# Patient Record
Sex: Female | Born: 1975 | Race: White | Hispanic: No | Marital: Married | Smoking: Never smoker
Health system: Southern US, Community
[De-identification: ages and names within clinical notes are randomized; demographics above are authoritative.]

## PROBLEM LIST (undated history)

## (undated) DIAGNOSIS — T7840XA Allergy, unspecified, initial encounter: Secondary | ICD-10-CM

## (undated) DIAGNOSIS — Z1501 Genetic susceptibility to malignant neoplasm of breast: Secondary | ICD-10-CM

## (undated) DIAGNOSIS — E785 Hyperlipidemia, unspecified: Secondary | ICD-10-CM

## (undated) DIAGNOSIS — N6320 Unspecified lump in the left breast, unspecified quadrant: Secondary | ICD-10-CM

## (undated) DIAGNOSIS — O1493 Unspecified pre-eclampsia, third trimester: Secondary | ICD-10-CM

## (undated) DIAGNOSIS — Z87442 Personal history of urinary calculi: Secondary | ICD-10-CM

## (undated) DIAGNOSIS — Z1509 Genetic susceptibility to other malignant neoplasm: Secondary | ICD-10-CM

## (undated) DIAGNOSIS — Z973 Presence of spectacles and contact lenses: Secondary | ICD-10-CM

## (undated) DIAGNOSIS — M1990 Unspecified osteoarthritis, unspecified site: Secondary | ICD-10-CM

## (undated) DIAGNOSIS — K219 Gastro-esophageal reflux disease without esophagitis: Secondary | ICD-10-CM

## (undated) DIAGNOSIS — J40 Bronchitis, not specified as acute or chronic: Secondary | ICD-10-CM

## (undated) DIAGNOSIS — F419 Anxiety disorder, unspecified: Secondary | ICD-10-CM

## (undated) DIAGNOSIS — D649 Anemia, unspecified: Secondary | ICD-10-CM

## (undated) DIAGNOSIS — C801 Malignant (primary) neoplasm, unspecified: Secondary | ICD-10-CM

## (undated) DIAGNOSIS — F32A Depression, unspecified: Secondary | ICD-10-CM

## (undated) HISTORY — DX: Allergy, unspecified, initial encounter: T78.40XA

## (undated) HISTORY — PX: MASTECTOMY: SHX3

## (undated) HISTORY — DX: Anxiety disorder, unspecified: F41.9

## (undated) HISTORY — PX: COLONOSCOPY: SHX174

## (undated) HISTORY — DX: Hyperlipidemia, unspecified: E78.5

## (undated) HISTORY — PX: KNEE ARTHROSCOPY: SUR90

## (undated) HISTORY — DX: Depression, unspecified: F32.A

## (undated) HISTORY — DX: Gastro-esophageal reflux disease without esophagitis: K21.9

## (undated) HISTORY — PX: OTHER SURGICAL HISTORY: SHX169

## (undated) HISTORY — PX: TUBAL LIGATION: SHX77

## (undated) HISTORY — DX: Unspecified osteoarthritis, unspecified site: M19.90

---

## 2004-04-16 ENCOUNTER — Observation Stay: Payer: Self-pay | Admitting: Obstetrics and Gynecology

## 2004-07-02 ENCOUNTER — Observation Stay: Payer: Self-pay

## 2004-07-13 ENCOUNTER — Inpatient Hospital Stay: Payer: Self-pay

## 2005-07-08 ENCOUNTER — Ambulatory Visit: Payer: Self-pay | Admitting: Gastroenterology

## 2005-10-31 ENCOUNTER — Emergency Department: Payer: Self-pay | Admitting: Emergency Medicine

## 2005-11-01 ENCOUNTER — Ambulatory Visit: Payer: Self-pay | Admitting: Emergency Medicine

## 2005-11-01 ENCOUNTER — Emergency Department: Payer: Self-pay | Admitting: Emergency Medicine

## 2005-12-27 ENCOUNTER — Ambulatory Visit: Payer: Self-pay | Admitting: Gastroenterology

## 2006-09-15 ENCOUNTER — Observation Stay: Payer: Self-pay

## 2006-10-07 ENCOUNTER — Ambulatory Visit: Payer: Self-pay | Admitting: Obstetrics and Gynecology

## 2006-10-16 ENCOUNTER — Observation Stay: Payer: Self-pay | Admitting: Certified Nurse Midwife

## 2006-11-23 ENCOUNTER — Inpatient Hospital Stay: Payer: Self-pay | Admitting: Obstetrics and Gynecology

## 2007-04-10 ENCOUNTER — Ambulatory Visit: Payer: Self-pay | Admitting: Obstetrics and Gynecology

## 2007-08-09 ENCOUNTER — Ambulatory Visit: Payer: Self-pay

## 2008-04-24 ENCOUNTER — Emergency Department: Payer: Self-pay | Admitting: Emergency Medicine

## 2008-04-30 ENCOUNTER — Ambulatory Visit: Payer: Self-pay | Admitting: Specialist

## 2008-05-02 ENCOUNTER — Ambulatory Visit: Payer: Self-pay | Admitting: Specialist

## 2009-04-06 ENCOUNTER — Observation Stay: Payer: Self-pay | Admitting: Obstetrics and Gynecology

## 2009-04-10 ENCOUNTER — Inpatient Hospital Stay: Payer: Self-pay | Admitting: Obstetrics and Gynecology

## 2009-11-15 IMAGING — US ULTRASOUND LEFT BREAST
1 series · 17 of 25 positions shown · non-contrast
Comparison: none

REASON FOR EXAM: pain and redness
COMMENTS:

[Series 1: ultrasound left breast · 17 of 30 slices shown]
[im 1/30]
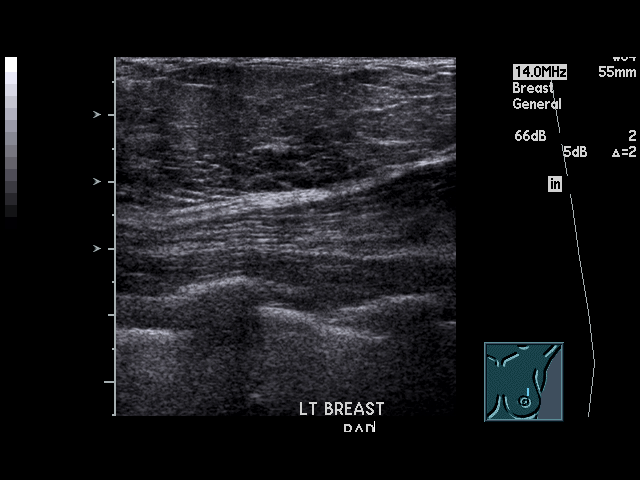
[im 3/30]
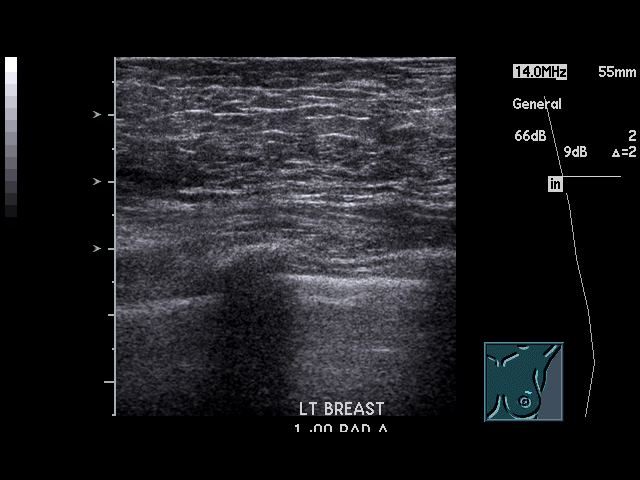
[im 4/30]
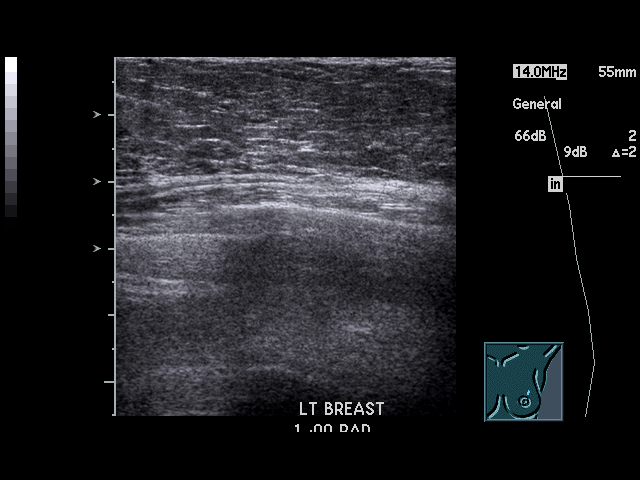
[im 7/30]
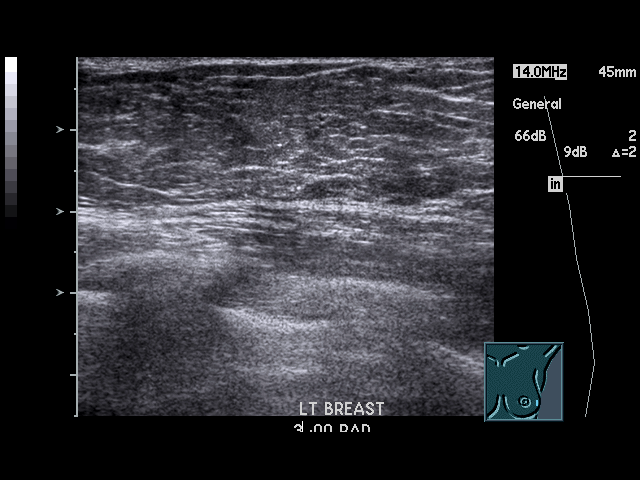
[im 8/30]
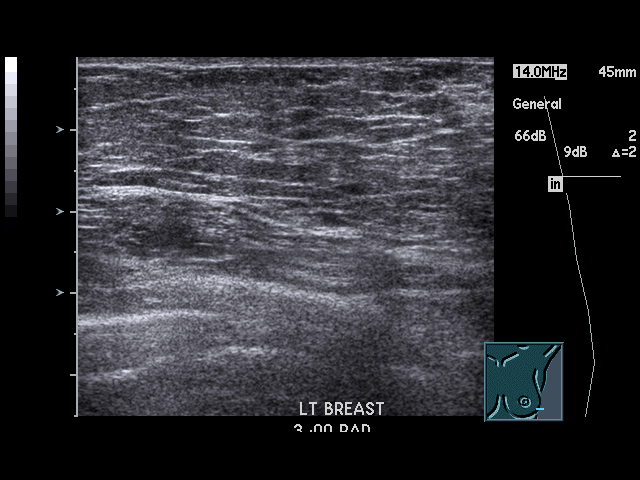
[im 10/30]
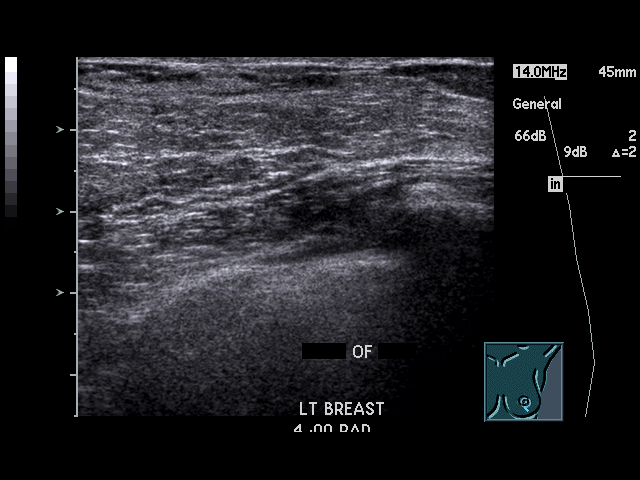
[im 11/30]
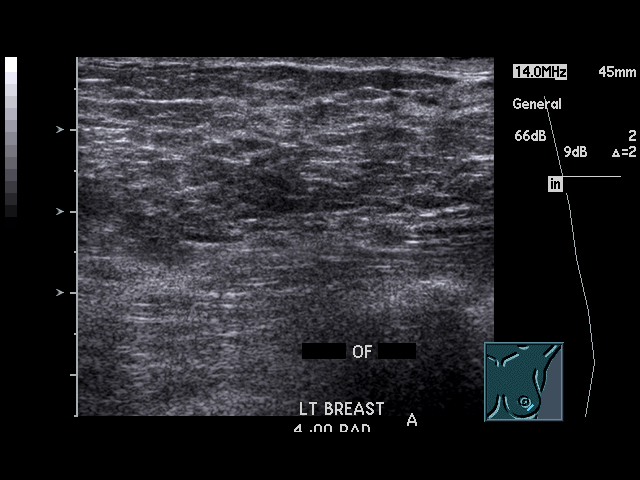
[im 14/30]
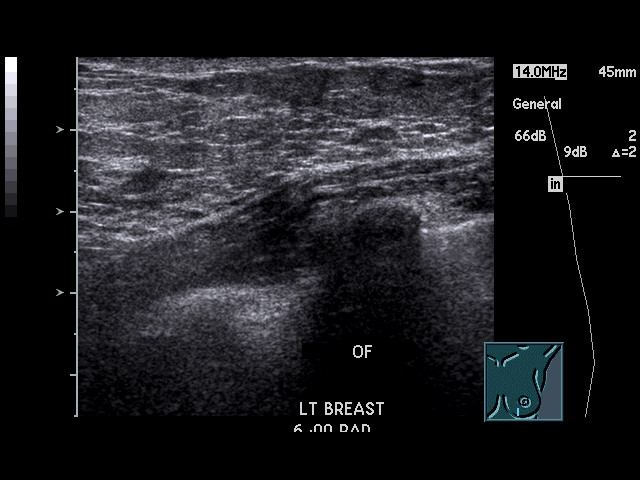
[im 15/30]
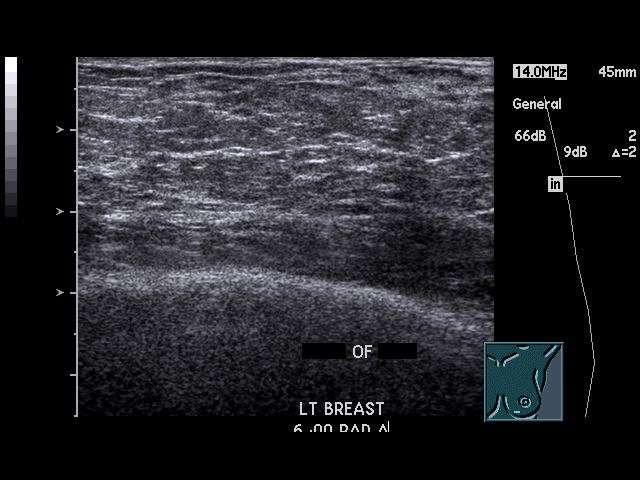
[im 16/30]
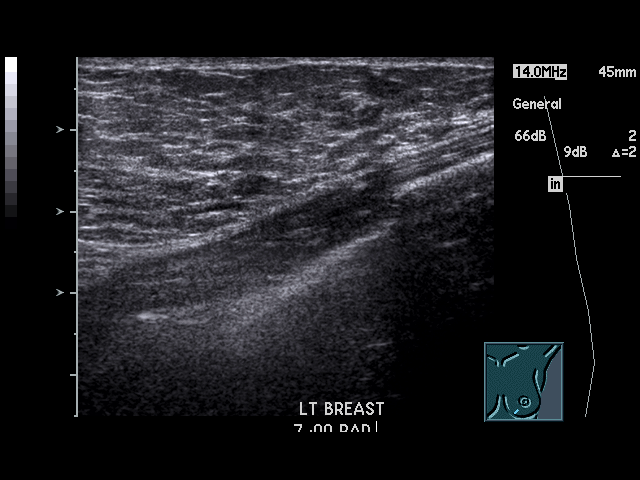
[im 19/30]
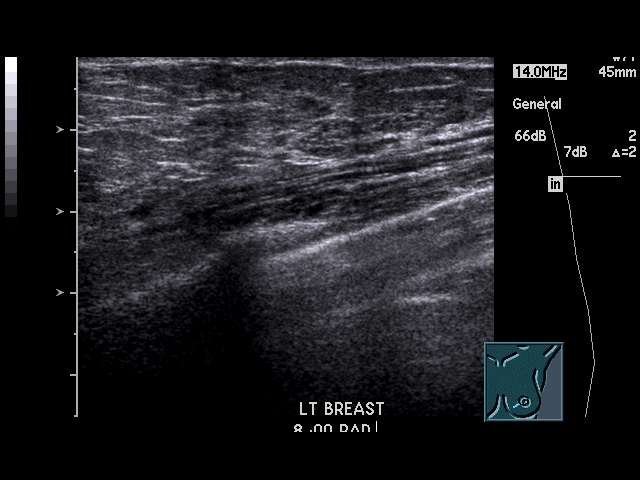
[im 20/30]
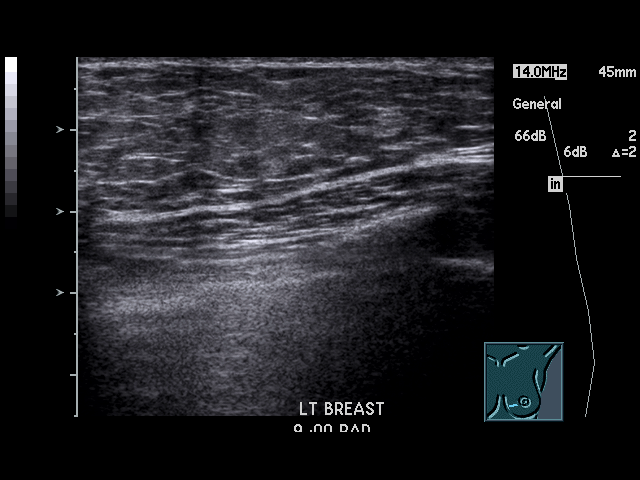
[im 22/30]
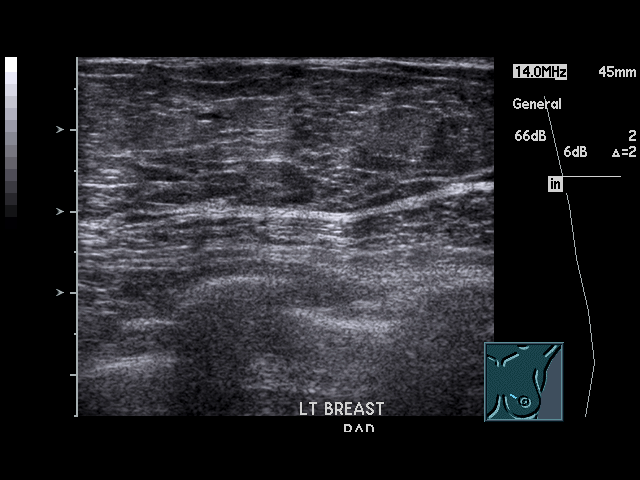
[im 23/30]
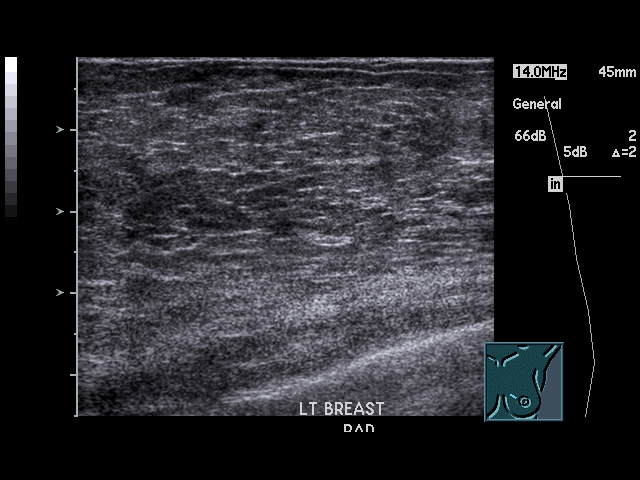
[im 26/30]
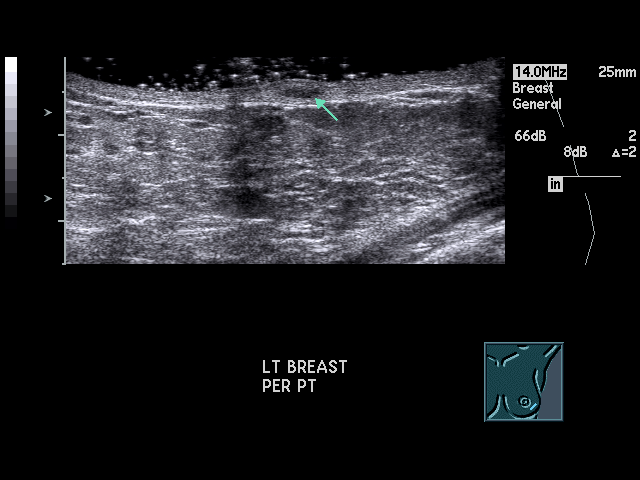
[im 27/30]
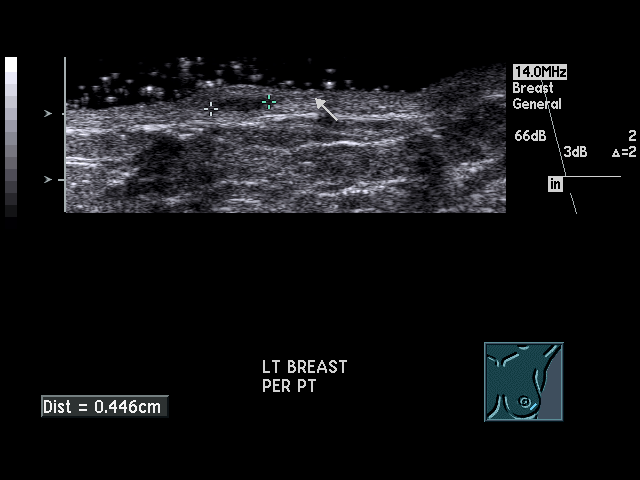
[im 30/30]
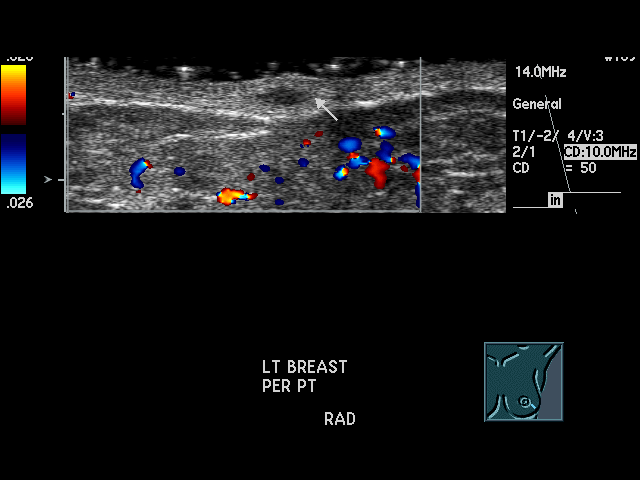

[17 of 25 positions shown; findings below may reference images not displayed]

PROCEDURE:     US  - US BREAST LEFT  - August 09, 2007  [DATE]

RESULT:     The patient has a palpable area of concern in the inferior
aspect of the LEFT breast at approximately 5 o'clock. At this site there is
an oval shaped 4.5 mm x 1.7 mm intradermal hypoechoic area consistent with a
cutaneous lesion. The etiology is indeterminate. Focal cutaneous infection,
insect or pimple are all considerations in the differential. If
symptomatology persists, Dermatology consult would be recommended since the
finding represents a cutaneous lesion. No sonographic abnormalities are
observed in the breast parenchyma.
IMPRESSION: 1.     Please see above.

## 2010-08-25 ENCOUNTER — Ambulatory Visit: Payer: Self-pay

## 2011-04-29 DIAGNOSIS — Z87442 Personal history of urinary calculi: Secondary | ICD-10-CM | POA: Insufficient documentation

## 2013-04-10 ENCOUNTER — Ambulatory Visit: Payer: BC Managed Care – PPO | Admitting: Lab

## 2013-04-10 ENCOUNTER — Encounter: Payer: Self-pay | Admitting: Genetic Counselor

## 2013-04-10 ENCOUNTER — Ambulatory Visit (HOSPITAL_BASED_OUTPATIENT_CLINIC_OR_DEPARTMENT_OTHER): Payer: BC Managed Care – PPO | Admitting: Genetic Counselor

## 2013-04-10 DIAGNOSIS — Z803 Family history of malignant neoplasm of breast: Secondary | ICD-10-CM

## 2013-04-10 DIAGNOSIS — IMO0002 Reserved for concepts with insufficient information to code with codable children: Secondary | ICD-10-CM

## 2013-04-10 DIAGNOSIS — Z8481 Family history of carrier of genetic disease: Secondary | ICD-10-CM

## 2013-04-10 NOTE — Progress Notes (Addendum)
Debra Lowery, a 37 y.o. female patient of Dr. Zelphia Cairo, was seen for discussion of her family history of breast cancer and known familial BRCA1 mutation.  She presents to clinic today to discuss the possibility of a genetic predisposition to cancer, and to further clarify her risks, as well as her family members' risks for cancer.   HISTORY OF PRESENT ILLNESS: CAROLLYNN Lowery is a 37 y.o. female with no personal history of cancer.  She had a colonoscopy in her early 30s because she thought she had chroanes  Disease, but was determined to have a virus.  She did not have any polyps.  She has not started having mammograms.  Annlouise's mother was found to have a BRCA1 mutation.  Demmi came in with her mother, to learn about the results and decided to pursue genetic testing.  History reviewed. No pertinent past medical history.  History reviewed. No pertinent past surgical history.  History   Social History  . Marital Status: Married    Spouse Name: N/A    Number of Children: 4  . Years of Education: N/A   Social History Main Topics  . Smoking status: Never Smoker   . Smokeless tobacco: None  . Alcohol Use: No  . Drug Use: No  . Sexual Activity: Yes   Other Topics Concern  . None   Social History Narrative  . None    REPRODUCTIVE HISTORY AND PERSONAL RISK ASSESSMENT FACTORS: Menarche was at age 49.   premenopausal Uterus Intact: yes Ovaries Intact: yes G4P4A0, first live birth at age 22  She has not previously undergone treatment for infertility.   Oral Contraceptive use: 5 years   She has not used HRT in the past.    FAMILY HISTORY:  We obtained a detailed, 4-generation family history.  Significant diagnoses are listed below: Family History  Problem Relation Age of Onset  . Breast cancer Mother 25  . Breast cancer Paternal Aunt 74  . Breast cancer Maternal Grandmother 34  . Esophageal cancer Maternal Grandmother   . Prostate cancer Maternal  Grandfather 3    Patient's maternal ancestors are of [place] and [place] descent, and paternal ancestors are of unknown descent. There is no reported Ashkenazi Jewish ancestry. There is no known consanguinity.  GENETIC COUNSELING ASSESSMENT: Debra Lowery is a 37 y.o. 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 We, therefore, discussed and recommended the following at today's visit.   DISCUSSION: We reviewed the characteristics, features and inheritance patterns of hereditary cancer syndromes. We also discussed genetic testing, including the appropriate family members to test, the process of testing, insurance coverage and turn-around-time for results. We discussed that she is at 50% risk for having the same mutation as her mother.  The NCCN guidelines were reviewed and we discussed that if she were positive for the BRCA1 mutation we could change her medical mangament to help keep her healthy.  This could include increased screening or risk reducing surgery.  She understood the information and wanted to get her blood drawn for genetic testing.  PLAN: After considering the risks, benefits, and limitations, Debra Lowery provided informed consent to pursue genetic testing and the blood sample will be sent to [facility] for analysis of the BRCA1 familial mutation. We discussed the implications of a positive, negative and/ or variant of uncertain significance genetic test  result. Results should be available within approximately 1-2 weeks' time, at which point they will be disclosed by telephone to Debra Lowery, as will any additional recommendations warranted by these results. Debra Lowery will receive a summary of her genetic counseling visit and a copy of her results once available. This information will also be available in Epic. We  encouraged Debra Lowery to remain in contact with cancer genetics annually so that we can continuously update the family history and inform her of any changes in cancer genetics and testing that may be of benefit for her family. Debra Lowery's questions were answered to her satisfaction today. Our contact information was provided should additional questions or concerns arise.  The patient was seen for a total of 45 minutes, greater than 50% of which was spent face-to-face counseling.  This note will also be sent to the referring provider via the electronic medical record. The patient will be supplied with a summary of this genetic counseling discussion as well as educational information on the discussed hereditary cancer syndromes following the conclusion of their visit.   Patient was discussed with Dr. Drue Second.   _______________________________________________________________________ For Office Staff:  Number of people involved in session: 3 Was an Intern/ student involved with case: no

## 2013-04-16 ENCOUNTER — Telehealth: Payer: Self-pay | Admitting: Genetic Counselor

## 2013-04-16 NOTE — Telephone Encounter (Signed)
Revealed that Debra Lowery tested positive for a BRCA1 mutation.  Debra Lowery wants to wait to come in or for making referrals to surgeon until after her mother has completed her treatment.  [name] will contact me when Debra Lowery is ready to come in.  In the meantime we will send a copy of her result to her physician, Dr. Zelphia Cairo.

## 2013-04-17 ENCOUNTER — Encounter: Payer: Self-pay | Admitting: Genetic Counselor

## 2013-04-17 DIAGNOSIS — Z1501 Genetic susceptibility to malignant neoplasm of breast: Secondary | ICD-10-CM

## 2013-04-17 DIAGNOSIS — Z1509 Genetic susceptibility to other malignant neoplasm: Secondary | ICD-10-CM

## 2013-04-17 HISTORY — DX: Genetic susceptibility to other malignant neoplasm: Z15.09

## 2013-04-17 HISTORY — DX: Genetic susceptibility to malignant neoplasm of breast: Z15.01

## 2014-03-01 ENCOUNTER — Other Ambulatory Visit: Payer: Self-pay | Admitting: Obstetrics and Gynecology

## 2014-03-01 DIAGNOSIS — Z803 Family history of malignant neoplasm of breast: Secondary | ICD-10-CM

## 2014-03-14 ENCOUNTER — Ambulatory Visit (INDEPENDENT_AMBULATORY_CARE_PROVIDER_SITE_OTHER): Payer: Self-pay | Admitting: Surgery

## 2014-03-14 DIAGNOSIS — Z1501 Genetic susceptibility to malignant neoplasm of breast: Secondary | ICD-10-CM

## 2014-03-14 DIAGNOSIS — Z1509 Genetic susceptibility to other malignant neoplasm: Principal | ICD-10-CM

## 2014-03-14 NOTE — H&P (Signed)
Debra Lowery 03/14/2014 9:27 AM Location: [facility] Patient #: [id number] DOB: Jun 17, 1975 Married / Language: [name] / Race: White Female History of Present Illness Debra Moores A. Marlo Arriola MD; 03/14/2014 5:01 PM) Patient words: pre-op visit breast   Patient sent at the request of Dr. [name] . Patient is BRCA 1 positive after genetic counseling consultation. Both her mother and grandmother have had breast cancer which prompted the evaluation. She is here to discuss options from the standpoint of her breast. She would like to discuss mastectomy as well as nonoperative options of dealing with this. Denies any history of breast pain, nipple discharge or breast mass. Last mammogram was 6 months ago and was normal. She will eventually require hysterectomy and removal of ovaries at some point she tells me.  The patient is a 38 year old female   Allergies ([place], [place]; 03/14/2014 9:28 AM) No Known Drug Allergies 03/12/2014  Medication History ([name], RMA; 03/14/2014 9:29 AM) Silymarin (Oral) Active. Acidophilus (Oral) Active. Medications Reconciled  Family History ([name], [place]; 03/14/2014 9:27 AM) Diabetes Mellitus Father. Hypertension Father.  Pregnancy / Birth History [name], [place]; 03/14/2014 9:27 AM) Contraceptive History Oral contraceptives.     Review of Systems ([name] RMA; 03/14/2014 9:27 AM) Gastrointestinal Not Present- Abdominal Pain, Bloating, Bloody Stool, Change in Bowel Habits, Chronic diarrhea, Constipation, Difficulty Swallowing, Excessive gas, Gets full quickly at meals, Hemorrhoids, Indigestion, Nausea, Rectal Pain and Vomiting. Female Genitourinary Not Present- Frequency, Nocturia, Painful Urination, Pelvic Pain and Urgency. Neurological Not Present- Decreased Memory, Fainting, Headaches, Numbness, Seizures, Tingling, Tremor, Trouble walking and Weakness.  Vitals  ([name] RMA; 03/14/2014 9:27 AM) 03/14/2014 9:27 AM Weight: 155.2 lb Height: 64in Body Surface Area: 1.78 m Body Mass Index: 26.64 kg/m Temp.: 97.42F(Oral)  Pulse: 61 (Regular)  P.OX: 98% (Room air) BP: 118/60 (Sitting, Right Arm, Standard)     Physical Exam ([name] A. Aerika Groll MD; 03/14/2014 5:02 PM)  General Mental Status-Alert. General Appearance-Consistent with stated age. Hydration-Well hydrated. Voice-Normal.  Head and Neck Head-normocephalic, atraumatic with no lesions or palpable masses. Trachea-midline. Thyroid Gland Characteristics - normal size and consistency.  Eye Eyeball - Bilateral-Extraocular movements intact. Sclera/Conjunctiva - Bilateral-No scleral icterus.  Chest and Lung Exam Chest and lung exam reveals -quiet, even and easy respiratory effort with no use of accessory muscles and on auscultation, normal breath sounds, no adventitious sounds and normal vocal resonance. Inspection Chest Wall - Normal. Back - normal.  Breast Breast - Left-Symmetric, Non Tender, No Biopsy scars, no Dimpling, No Inflammation, No Lumpectomy scars, No Mastectomy scars, No Peau d' Orange. Breast - Right-Symmetric, Non Tender, No Biopsy scars, no Dimpling, No Inflammation, No Lumpectomy scars, No Mastectomy scars, No Peau d' Orange. Breast Lump-No Palpable Breast Mass.  Cardiovascular Cardiovascular examination reveals -normal heart sounds, regular rate and rhythm with no murmurs and normal pedal pulses bilaterally.  Abdomen Inspection Inspection of the abdomen reveals - No Hernias. Skin - Scar - no surgical scars. Palpation/Percussion Palpation and Percussion of the abdomen reveal - Soft, Non Tender, No Rebound tenderness, No Rigidity (guarding) and No hepatosplenomegaly. Auscultation Auscultation of the abdomen reveals - Bowel sounds normal.  Neurologic Neurologic evaluation reveals -alert and oriented x 3 with no  impairment of recent or remote memory. Mental Status-Normal.  Musculoskeletal Normal Exam - Left-Upper Extremity Strength Normal and Lower Extremity Strength Normal. Normal Exam - Right-Upper Extremity Strength Normal and Lower Extremity Strength Normal.  Lymphatic Head & Neck  General Head & Neck Lymphatics: Bilateral -  Description - Normal. Axillary  General Axillary Region: Bilateral - Description - Normal. Tenderness - Non Tender. Femoral & Inguinal  Generalized Femoral & Inguinal Lymphatics: Bilateral - Description - Normal. Tenderness - Non Tender.    Assessment & Plan ([name] A. Kali Deadwyler MD; 03/14/2014 5:07 PM)  BRCA1 POSITIVE (V84.01  Z15.01) Impression: discussed options of bilateral mastectomy versus surveillance with MRI. Discussed nipple preservation given that she appears to be an ideal candidate. Referral to plastic surgery to discuss reconstruction. discussed utility of sentinel lymph node mapping. Would obtain MRI prior to mastectomy given density of breast on exam. She will call to schedule.  Current Plans BRCA1/BRCA2 FULL SEQUENCE ANALYSIS (81211) MASTECTOMY, NIPPLE SPARING (16429) (Procedure discussed with patient. Risk of bleeding, infection, nipple loss, lymphedema, need for other operative procedures, numbness across skin, and cosmetic deformity.) Referred to Surgery ? Plastic, for evaluation and follow up (Plastic Surgery).

## 2014-04-18 ENCOUNTER — Other Ambulatory Visit (INDEPENDENT_AMBULATORY_CARE_PROVIDER_SITE_OTHER): Payer: Self-pay

## 2014-04-18 DIAGNOSIS — Z1501 Genetic susceptibility to malignant neoplasm of breast: Secondary | ICD-10-CM

## 2014-04-18 DIAGNOSIS — Z1509 Genetic susceptibility to other malignant neoplasm: Principal | ICD-10-CM

## 2014-04-24 ENCOUNTER — Other Ambulatory Visit: Payer: BC Managed Care – PPO

## 2014-05-14 ENCOUNTER — Ambulatory Visit
Admission: RE | Admit: 2014-05-14 | Discharge: 2014-05-14 | Disposition: A | Discharge status: Home / Self Care | Payer: BC Managed Care – PPO | Source: Ambulatory Visit | Attending: Surgery | Admitting: Surgery

## 2014-05-14 DIAGNOSIS — Z1501 Genetic susceptibility to malignant neoplasm of breast: Secondary | ICD-10-CM

## 2014-05-14 DIAGNOSIS — Z1509 Genetic susceptibility to other malignant neoplasm: Principal | ICD-10-CM

## 2014-05-14 MED ORDER — GADOBENATE DIMEGLUMINE 529 MG/ML IV SOLN
13.0000 mL | Freq: Once | INTRAVENOUS | Status: AC | PRN
  Administered 2014-05-14: 13 mL via INTRAVENOUS

## 2014-05-16 ENCOUNTER — Other Ambulatory Visit (INDEPENDENT_AMBULATORY_CARE_PROVIDER_SITE_OTHER): Payer: Self-pay | Admitting: Surgery

## 2014-05-16 DIAGNOSIS — R928 Other abnormal and inconclusive findings on diagnostic imaging of breast: Secondary | ICD-10-CM

## 2014-05-24 ENCOUNTER — Ambulatory Visit
Admission: RE | Admit: 2014-05-24 | Discharge: 2014-05-24 | Disposition: A | Discharge status: Home / Self Care | Payer: BC Managed Care – PPO | Source: Ambulatory Visit | Attending: Surgery | Admitting: Surgery

## 2014-05-24 ENCOUNTER — Other Ambulatory Visit (INDEPENDENT_AMBULATORY_CARE_PROVIDER_SITE_OTHER): Payer: Self-pay | Admitting: Surgery

## 2014-05-24 DIAGNOSIS — R928 Other abnormal and inconclusive findings on diagnostic imaging of breast: Secondary | ICD-10-CM

## 2014-06-13 ENCOUNTER — Ambulatory Visit (INDEPENDENT_AMBULATORY_CARE_PROVIDER_SITE_OTHER): Payer: Self-pay | Admitting: Surgery

## 2014-06-13 DIAGNOSIS — Z1509 Genetic susceptibility to other malignant neoplasm: Principal | ICD-10-CM

## 2014-06-13 DIAGNOSIS — Z1501 Genetic susceptibility to malignant neoplasm of breast: Secondary | ICD-10-CM

## 2014-06-13 NOTE — H&P (Signed)
Debra Lowery 06/13/2014 10:02 AM Location: [facility] Patient #: [id number] DOB: 03/01/1976 Married / Language: Debra Lowery / Race: White Female History of Present Illness Debra Lowery A. Ewart Carrera MD; 06/13/2014 12:34 PM) Patient words: discuss mastectomy   pt returns to discuss bilateral simple mastectomy and reconstruction again after getter her MRI which showed a 1.8 cm left breast mas that core bx showed was benign. Discussed with radiology and they had concerns of discordant pathology despite benign core. right breast showed no worrisome lesions. pt desires bilateral mastectomy and has an interest in NSM and has discussed this with Dr Migdalia Dk. PT IS BRCA 1 POSITIVE AND HAS STRONG FAMILY HISTORY OF BREAST CANCER.  CLINICAL DATA: BRCA 1 positive patient. High risk screening evaluation.  EXAM: BILATERAL BREAST MRI WITH AND WITHOUT CONTRAST  TECHNIQUE: Multiplanar, multisequence MR images of both breasts were obtained prior to and following the intravenous administration of 96m of MultiHance  THREE-DIMENSIONAL MR IMAGE RENDERING ON INDEPENDENT WORKSTATION:  Three-dimensional MR images were rendered by post-processing of the original MR data on an independent workstation. The three-dimensional MR images were interpreted, and findings are reported in the following complete MRI report for this study. Three dimensional images were evaluated at the independent DynaCad workstation  COMPARISON: Mammograms dated 11/19/2013, 03/16/2013, 05/07/2011.  FINDINGS: Breast composition: b. Scattered fibroglandular tissue.  Background parenchymal enhancement: Mild  Right breast: No mass or abnormal enhancement.  Left breast: There is an irregular enhancing mass located within the posterior 1/3 of the upper inner quadrant of the left breast which measures 1.8 x 1.1 x 0.9 cm in size. This is associated with a mixture plateau and washout enhancement kinetics. Tissue sampling  is recommended. Left breast diagnostic mammogram and ultrasound is recommended for further evaluation. There are no additional worrisome enhancing foci within the left breast.  Lymph nodes: No abnormal appearing lymph nodes.  Ancillary findings: None.  IMPRESSION: 1.8 cm suspicious enhancing mass located within the posterior 1/3 of the upper inner quadrant of the left breast. Recommend left breast diagnostic mammogram with ultrasound at this time with a left breast ultrasound-guided core biopsy if there is an ultrasound correlate. If there is no ultrasound correlate, recommend MR guided biopsy.  RECOMMENDATION: Left breast diagnostic mammogram and ultrasound with possible left breast ultrasound-guided core biopsy as discussed above.  BI-RADS CATEGORY 4: Suspicious.   Electronically Signed By: Debra RobertsonM.D. On: 05/15/2014 14:57     Breast, left, needle core biopsy, mass, UIQ - BENIGN BREAST TISSUE, SEE COMMENT. - NEGATIVE FOR ATYPIA OR MALIGNANCY. 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 Additional findings include fibroadenomatoid nodule. Smooth muscle actin highlights the stromal hyperplasia and calponin demonstrates occasional stromal positivity. (CRR:gt:ecj 05/28/14) CMaliRUND Lowery Pathologist, Electronic.  The patient is a 38year old female   Allergies (Debra Donna COak Lowery 06/13/2014 10:03 AM) No Known Drug Allergies 03/12/2014  Medication History (Debra Lowery, CMA; 06/13/2014 10:03 AM) Acidophilus (Oral) Active. Silymarin (Oral) Active.    Vitals (Debra Lowery CMA; 06/13/2014 10:03 AM) 06/13/2014 10:03 AM Weight: 146 lb Height: 64in Body Surface Area: 1.73 m Body Mass Index: 25.06 kg/m Pulse: 75 (Regular)  BP: 124/80 (Sitting, Left Arm, Standard)     Physical Exam (Debra Zackery A. Amritha Yorke MD;  06/13/2014 12:34 PM)  Chest and Lung Exam Chest and lung exam reveals -quiet, even and easy respiratory effort with no use of accessory muscles and on auscultation, normal breath  sounds, no adventitious sounds and normal vocal resonance. Inspection Chest Wall - Normal. Back - normal.  Breast Note: post biopsy changes noted left medial breast. ptosis of both breasts noted. no masses in either breast. both axilla normal.   Cardiovascular Cardiovascular examination reveals -normal heart sounds, regular rate and rhythm with no murmurs and normal pedal pulses bilaterally.  Neurologic Neurologic evaluation reveals -alert and oriented x 3 with no impairment of recent or remote memory. Mental Status-Normal.  Musculoskeletal Normal Exam - Left-Upper Extremity Strength Normal and Lower Extremity Strength Normal. Normal Exam - Right-Upper Extremity Strength Normal, Lower Extremity Weakness.  Lymphatic Axillary  General Axillary Region: Bilateral - Description - Normal. Tenderness - Non Tender.    Assessment & Plan (Debra Felling A. Mahamadou Weltz MD; 06/13/2014 12:33 PM)  BRCA1 POSITIVE (V84.01  Z15.01) Impression: discussed options of bilateral mastectomy versus surveillance with MRI. Discussed nipple preservation given that she appears to be an ideal candidate. Referral to plastic surgery to discuss reconstruction. discussed utility of sentinel lymph node mapping. Would obtain MRI prior to mastectomy given density of breast on exam. given mass on left and concern by radiologist recommend sln mapping on left and avoid this on the right given normal MRI . Discussed treatment options for breast cancer to include breast conservation vs mastectomy with reconstruction. Pt has decided on mastectomy. Risk include bleeding, infection, flap necrosis, pain, numbness, recurrence, hematoma, other surgery needs. Pt understands and agrees to proceed. Risk of sentinel lymph node mapping include bleeding,  infection, lymphedema, shoulder pain. stiffness, dye allergy. cosmetic deformity , blood clots, death, need for more surgery. Pt agres to proceed. UNDERSTANDS LOW BUT FINITE RISK OF FINDING OCCULT CANCER AND THE NEED FOR OTHER PROCEDURES. NIPPLE PRESERVATION POSSIBLE BUT PTOSIS MAY MAKE THIS AN ISSUE. HAS DISCUSSED WITH DR Migdalia Dk ALREADY AND AWARE SHE MAY NEED TRADITIONAL MASTECTOMY. .  Current Plans Pt Education - [facility] Mastectomy [facility]

## 2014-06-14 HISTORY — PX: DILATION AND CURETTAGE OF UTERUS: SHX78

## 2014-07-09 NOTE — Pre-Procedure Instructions (Signed)
Debra Lowery  07/09/2014   Your procedure is scheduled on:  Thursday July 18, 2014 at 9:00 AM.  Report to [facility] Admitting at 7:00 AM.  Call this number if you have problems the morning of surgery: [phone]  For any other questions Monday-Friday from 8am-4pm call: [phone]  Remember:   Do not eat food or drink liquids after midnight.   Take these medicines the morning of surgery with A SIP OF WATER: None  Stop taking Aspirin, vitamins,  and herbal medications. Do not take any NSAIDs ie: Ibuprofen, Advil, Naproxen or any medication containing Aspirin; stop 1 week prior to procedure ( Thursday, July 11, 2014)  Do not wear jewelry, make-up or nail polish.  Do not wear lotions, powders, or perfumes. You may NOT wear deodorant.  Do not shave 48 hours prior to surgery.   Do not bring valuables to the hospital.  [facility] is not responsible for any belongings or valuables.               Contacts, dentures or bridgework may not be worn into surgery.  Leave suitcase in the car. After surgery it may be brought to your room.  For patients admitted to the hospital, discharge time is determined by your treatment team.               Patients discharged the day of surgery will not be allowed to drive home.  Name and phone number of your driver:   Special Instructions: Shower using CHG soap the night before and the morning of your surgery   Please read over the following fact sheets that you were given: Pain Booklet, Coughing and Deep Breathing and Surgical Site Infection Prevention

## 2014-07-10 ENCOUNTER — Encounter (HOSPITAL_COMMUNITY): Payer: Self-pay

## 2014-07-10 ENCOUNTER — Encounter (HOSPITAL_COMMUNITY)
Admission: RE | Admit: 2014-07-10 | Discharge: 2014-07-10 | Disposition: A | Discharge status: Home / Self Care | Payer: BLUE CROSS/BLUE SHIELD | Source: Ambulatory Visit | Attending: Surgery | Admitting: Surgery

## 2014-07-10 VITALS — BP 106/54 | HR 63 | Temp 97.8°F | Resp 20 | Ht 64.0 in | Wt 148.7 lb

## 2014-07-10 DIAGNOSIS — C50919 Malignant neoplasm of unspecified site of unspecified female breast: Secondary | ICD-10-CM | POA: Diagnosis not present

## 2014-07-10 DIAGNOSIS — Z1501 Genetic susceptibility to malignant neoplasm of breast: Secondary | ICD-10-CM

## 2014-07-10 DIAGNOSIS — Z01812 Encounter for preprocedural laboratory examination: Secondary | ICD-10-CM | POA: Diagnosis not present

## 2014-07-10 DIAGNOSIS — Z1509 Genetic susceptibility to other malignant neoplasm: Secondary | ICD-10-CM

## 2014-07-10 HISTORY — DX: Anemia, unspecified: D64.9

## 2014-07-10 HISTORY — DX: Genetic susceptibility to other malignant neoplasm: Z15.09

## 2014-07-10 HISTORY — DX: Presence of spectacles and contact lenses: Z97.3

## 2014-07-10 HISTORY — DX: Unspecified lump in the left breast, unspecified quadrant: N63.20

## 2014-07-10 HISTORY — DX: Genetic susceptibility to malignant neoplasm of breast: Z15.01

## 2014-07-10 HISTORY — DX: Bronchitis, not specified as acute or chronic: J40

## 2014-07-10 HISTORY — DX: Unspecified pre-eclampsia, third trimester: O14.93

## 2014-07-10 LAB — CBC
HCT: 39.9 % (ref 36.0–46.0)
Hemoglobin: 13.4 g/dL (ref 12.0–15.0)
MCH: 27.6 pg (ref 26.0–34.0)
MCHC: 33.6 g/dL (ref 30.0–36.0)
MCV: 82.1 fL (ref 78.0–100.0)
PLATELETS: 245 10*3/uL (ref 150–400)
RBC: 4.86 MIL/uL (ref 3.87–5.11)
RDW: 12.7 % (ref 11.5–15.5)
WBC: 7.3 10*3/uL (ref 4.0–10.5)

## 2014-07-10 LAB — BASIC METABOLIC PANEL
Anion gap: 7 (ref 5–15)
BUN: 12 mg/dL (ref 6–23)
CALCIUM: 9.5 mg/dL (ref 8.4–10.5)
CO2: 25 mmol/L (ref 19–32)
Chloride: 106 mmol/L (ref 96–112)
Creatinine, Ser: 0.82 mg/dL (ref 0.50–1.10)
GFR calc Af Amer: >90 mL/min (ref >90)
GFR, EST NON AFRICAN AMERICAN: 89 mL/min — AB (ref >90)
Glucose, Bld: 100 mg/dL — ABNORMAL HIGH (ref 70–99)
Potassium: 4.2 mmol/L (ref 3.5–5.1)
Sodium: 138 mmol/L (ref 135–145)

## 2014-07-10 LAB — HCG, SERUM, QUALITATIVE: Preg, Serum: NEGATIVE

## 2014-07-10 NOTE — Progress Notes (Signed)
Pt denies SOB, chest pain, and being under the care of a cardiologist. Pt denies having a stress test, echo, and cardiac cath. Pt denies having an EKG and chest x ray within the last year and having a PCP. Left voice message with Maudie Mercury, surgical scheduler for Dr. Brantley Stage to clarify if a separate consent is needed for Dr. Willow Ora part in the upcoming procedure ( expanders).

## 2014-07-11 ENCOUNTER — Other Ambulatory Visit: Payer: Self-pay | Admitting: Plastic Surgery

## 2014-07-11 DIAGNOSIS — C50919 Malignant neoplasm of unspecified site of unspecified female breast: Secondary | ICD-10-CM

## 2014-07-17 ENCOUNTER — Other Ambulatory Visit: Payer: Self-pay | Admitting: Plastic Surgery

## 2014-07-17 MED ORDER — DEXTROSE 5 % IV SOLN
3.0000 g | INTRAVENOUS | Status: AC
  Administered 2014-07-18 (×2): 2 g via INTRAVENOUS
  Filled 2014-07-17: qty 3000

## 2014-07-17 NOTE — Anesthesia Preprocedure Evaluation (Addendum)
Anesthesia Evaluation  Patient identified by MRN, date of birth, ID band Patient awake    Reviewed: Allergy & Precautions, NPO status , Patient's Chart, lab work & pertinent test results, reviewed documented beta blocker date and time   Airway Mallampati: II   Neck ROM: Full    Dental  (+) Teeth Intact   Pulmonary neg pulmonary ROS,  breath sounds clear to auscultation        Cardiovascular hypertension, Rhythm:Regular     Neuro/Psych negative neurological ROS  negative psych ROS   GI/Hepatic negative GI ROS, Neg liver ROS,   Endo/Other    Renal/GU GFR 89     Musculoskeletal   Abdominal (+)  Abdomen: soft.    Peds  Hematology 13/39 H/H   Anesthesia Other Findings   Reproductive/Obstetrics                            Anesthesia Physical Anesthesia Plan  ASA: II  Anesthesia Plan: General   Post-op Pain Management: MAC Combined w/ Regional for Post-op pain   Induction: Intravenous  Airway Management Planned: Oral ETT  Additional Equipment:   Intra-op Plan:   Post-operative Plan: Extubation in OR  Informed Consent: I have reviewed the patients History and Physical, chart, labs and discussed the procedure including the risks, benefits and alternatives for the proposed anesthesia with the patient or authorized representative who has indicated his/her understanding and acceptance.     Plan Discussed with:   Anesthesia Plan Comments:         Anesthesia Quick Evaluation

## 2014-07-18 ENCOUNTER — Encounter (HOSPITAL_COMMUNITY)
Admission: RE | Admit: 2014-07-18 | Discharge: 2014-07-18 | Disposition: A | Discharge status: Home / Self Care | Payer: BLUE CROSS/BLUE SHIELD | Source: Ambulatory Visit | Attending: Surgery | Admitting: Surgery

## 2014-07-18 ENCOUNTER — Encounter (HOSPITAL_COMMUNITY)
Admission: RE | Disposition: A | Discharge status: Home / Self Care | Payer: Self-pay | Source: Ambulatory Visit | Attending: Plastic Surgery

## 2014-07-18 ENCOUNTER — Encounter (HOSPITAL_COMMUNITY): Payer: Self-pay | Admitting: *Deleted

## 2014-07-18 ENCOUNTER — Ambulatory Visit (HOSPITAL_COMMUNITY): Payer: BLUE CROSS/BLUE SHIELD | Admitting: Certified Registered Nurse Anesthetist

## 2014-07-18 ENCOUNTER — Inpatient Hospital Stay (HOSPITAL_COMMUNITY)
Admission: RE | Admit: 2014-07-18 | Discharge: 2014-07-20 | DRG: 585 | Disposition: A | Discharge status: Home / Self Care | Payer: BLUE CROSS/BLUE SHIELD | Source: Ambulatory Visit | Attending: Plastic Surgery | Admitting: Plastic Surgery

## 2014-07-18 DIAGNOSIS — C50919 Malignant neoplasm of unspecified site of unspecified female breast: Secondary | ICD-10-CM

## 2014-07-18 DIAGNOSIS — Z1501 Genetic susceptibility to malignant neoplasm of breast: Secondary | ICD-10-CM

## 2014-07-18 DIAGNOSIS — N63 Unspecified lump in breast: Principal | ICD-10-CM | POA: Diagnosis present

## 2014-07-18 DIAGNOSIS — C50912 Malignant neoplasm of unspecified site of left female breast: Secondary | ICD-10-CM

## 2014-07-18 DIAGNOSIS — Z1509 Genetic susceptibility to other malignant neoplasm: Secondary | ICD-10-CM

## 2014-07-18 HISTORY — DX: Genetic susceptibility to malignant neoplasm of breast: Z15.01

## 2014-07-18 HISTORY — DX: Genetic susceptibility to other malignant neoplasm: Z15.09

## 2014-07-18 HISTORY — PX: BREAST RECONSTRUCTION WITH PLACEMENT OF TISSUE EXPANDER AND FLEX HD (ACELLULAR HYDRATED DERMIS): SHX6295

## 2014-07-18 LAB — CBC
HEMATOCRIT: 36.5 % (ref 36.0–46.0)
HEMOGLOBIN: 12 g/dL (ref 12.0–15.0)
MCH: 27.6 pg (ref 26.0–34.0)
MCHC: 32.9 g/dL (ref 30.0–36.0)
MCV: 84.1 fL (ref 78.0–100.0)
PLATELETS: 248 10*3/uL (ref 150–400)
RBC: 4.34 MIL/uL (ref 3.87–5.11)
RDW: 12.9 % (ref 11.5–15.5)
WBC: 13.7 10*3/uL — AB (ref 4.0–10.5)

## 2014-07-18 LAB — BASIC METABOLIC PANEL
Anion gap: 6 (ref 5–15)
BUN: 11 mg/dL (ref 6–23)
CO2: 27 mmol/L (ref 19–32)
CREATININE: 0.83 mg/dL (ref 0.50–1.10)
Calcium: 8.6 mg/dL (ref 8.4–10.5)
Chloride: 106 mmol/L (ref 96–112)
GFR calc non Af Amer: 88 mL/min — ABNORMAL LOW (ref >90)
Glucose, Bld: 165 mg/dL — ABNORMAL HIGH (ref 70–99)
Potassium: 3.6 mmol/L (ref 3.5–5.1)
SODIUM: 139 mmol/L (ref 135–145)

## 2014-07-18 SURGERY — NIPPLE SPARING MASTECTOMY WITH SENTINAL LYMPH NODE BIOPSY AND  RECONSTRUCTION WITH PLACEMENT OF TISSUE EXPANDER
Anesthesia: General | Site: Breast | Laterality: Bilateral

## 2014-07-18 MED ORDER — BUPIVACAINE HCL (PF) 0.25 % IJ SOLN
INTRAMUSCULAR | Status: DC | PRN
  Administered 2014-07-18: 60 mL via PERINEURAL

## 2014-07-18 MED ORDER — GLYCOPYRROLATE 0.2 MG/ML IJ SOLN
INTRAMUSCULAR | Status: AC
  Filled 2014-07-18: qty 3

## 2014-07-18 MED ORDER — HYDROCODONE-ACETAMINOPHEN 5-325 MG PO TABS
1.0000 | ORAL_TABLET | ORAL | Status: DC | PRN
  Administered 2014-07-18 – 2014-07-20 (×5): 2 via ORAL
  Filled 2014-07-18 (×6): qty 2

## 2014-07-18 MED ORDER — SODIUM CHLORIDE 0.9 % IJ SOLN
INTRAMUSCULAR | Status: AC
  Filled 2014-07-18: qty 10

## 2014-07-18 MED ORDER — FENTANYL CITRATE 0.05 MG/ML IJ SOLN
50.0000 ug | INTRAMUSCULAR | Status: DC | PRN
  Administered 2014-07-18: 50 ug via INTRAVENOUS
  Administered 2014-07-18: 100 ug via INTRAVENOUS

## 2014-07-18 MED ORDER — DIPHENHYDRAMINE HCL 25 MG PO CAPS
25.0000 mg | ORAL_CAPSULE | Freq: Four times a day (QID) | ORAL | Status: DC | PRN
  Administered 2014-07-18 – 2014-07-20 (×7): 25 mg via ORAL
  Filled 2014-07-18 (×7): qty 1

## 2014-07-18 MED ORDER — ONDANSETRON HCL 4 MG/2ML IJ SOLN
INTRAMUSCULAR | Status: AC
  Filled 2014-07-18: qty 2

## 2014-07-18 MED ORDER — FENTANYL CITRATE 0.05 MG/ML IJ SOLN
INTRAMUSCULAR | Status: AC
  Filled 2014-07-18: qty 2

## 2014-07-18 MED ORDER — NEOSTIGMINE METHYLSULFATE 10 MG/10ML IV SOLN
INTRAVENOUS | Status: AC
  Filled 2014-07-18: qty 1

## 2014-07-18 MED ORDER — PROMETHAZINE HCL 25 MG/ML IJ SOLN: 6.2500 mg | INTRAMUSCULAR | Status: DC | PRN

## 2014-07-18 MED ORDER — MORPHINE SULFATE 2 MG/ML IJ SOLN
2.0000 mg | INTRAMUSCULAR | Status: DC | PRN
  Administered 2014-07-18 (×2): 2 mg via INTRAVENOUS
  Filled 2014-07-18 (×2): qty 1

## 2014-07-18 MED ORDER — ONDANSETRON HCL 4 MG/2ML IJ SOLN
4.0000 mg | Freq: Four times a day (QID) | INTRAMUSCULAR | Status: DC | PRN
Start: 2014-07-18 — End: 2014-07-20

## 2014-07-18 MED ORDER — FENTANYL CITRATE 0.05 MG/ML IJ SOLN
25.0000 ug | INTRAMUSCULAR | Status: DC | PRN
  Administered 2014-07-18: 50 ug via INTRAVENOUS

## 2014-07-18 MED ORDER — DIAZEPAM 2 MG PO TABS
2.0000 mg | ORAL_TABLET | Freq: Two times a day (BID) | ORAL | Status: DC | PRN
  Administered 2014-07-18 – 2014-07-20 (×4): 2 mg via ORAL
  Filled 2014-07-18 (×4): qty 1

## 2014-07-18 MED ORDER — ACETAMINOPHEN 10 MG/ML IV SOLN
INTRAVENOUS | Status: AC
  Filled 2014-07-18: qty 100

## 2014-07-18 MED ORDER — DEXAMETHASONE SODIUM PHOSPHATE 10 MG/ML IJ SOLN
INTRAMUSCULAR | Status: DC | PRN
  Administered 2014-07-18: 10 mg via INTRAVENOUS

## 2014-07-18 MED ORDER — FENTANYL CITRATE 0.05 MG/ML IJ SOLN
INTRAMUSCULAR | Status: AC
  Filled 2014-07-18: qty 5

## 2014-07-18 MED ORDER — SCOPOLAMINE 1 MG/3DAYS TD PT72
MEDICATED_PATCH | TRANSDERMAL | Status: DC | PRN
  Administered 2014-07-18: 1 via TRANSDERMAL

## 2014-07-18 MED ORDER — SODIUM CHLORIDE 0.9 % IR SOLN
Status: DC | PRN
  Administered 2014-07-18: 1000 mL

## 2014-07-18 MED ORDER — HYDROMORPHONE HCL 1 MG/ML IJ SOLN
INTRAMUSCULAR | Status: AC
  Administered 2014-07-18: 2 mg via INTRAVENOUS
  Filled 2014-07-18: qty 2

## 2014-07-18 MED ORDER — CHLORHEXIDINE GLUCONATE 4 % EX LIQD
1.0000 | Freq: Once | CUTANEOUS | Status: DC
Start: 2014-07-18 — End: 2014-07-18
  Filled 2014-07-18: qty 15

## 2014-07-18 MED ORDER — PHENYLEPHRINE 40 MCG/ML (10ML) SYRINGE FOR IV PUSH (FOR BLOOD PRESSURE SUPPORT)
PREFILLED_SYRINGE | INTRAVENOUS | Status: AC
  Filled 2014-07-18: qty 10

## 2014-07-18 MED ORDER — DIAZEPAM 5 MG PO TABS
ORAL_TABLET | ORAL | Status: AC
  Administered 2014-07-18: 5 mg
  Filled 2014-07-18: qty 1

## 2014-07-18 MED ORDER — MIDAZOLAM HCL 2 MG/2ML IJ SOLN
INTRAMUSCULAR | Status: AC
  Filled 2014-07-18: qty 2

## 2014-07-18 MED ORDER — KCL IN DEXTROSE-NACL 20-5-0.45 MEQ/L-%-% IV SOLN
INTRAVENOUS | Status: DC
  Administered 2014-07-18 – 2014-07-19 (×3): via INTRAVENOUS
  Filled 2014-07-18 (×8): qty 1000

## 2014-07-18 MED ORDER — CEFAZOLIN SODIUM-DEXTROSE 2-3 GM-% IV SOLR
INTRAVENOUS | Status: AC
  Filled 2014-07-18: qty 50

## 2014-07-18 MED ORDER — MEPERIDINE HCL 25 MG/ML IJ SOLN: 6.2500 mg | INTRAMUSCULAR | Status: DC | PRN

## 2014-07-18 MED ORDER — ACETAMINOPHEN 10 MG/ML IV SOLN
INTRAVENOUS | Status: DC | PRN
  Administered 2014-07-18: 1000 mg via INTRAVENOUS

## 2014-07-18 MED ORDER — PROPOFOL 10 MG/ML IV BOLUS
INTRAVENOUS | Status: AC
  Filled 2014-07-18: qty 20

## 2014-07-18 MED ORDER — HYDROMORPHONE HCL 1 MG/ML IJ SOLN
2.0000 mg | INTRAMUSCULAR | Status: DC | PRN
  Administered 2014-07-18 – 2014-07-20 (×6): 2 mg via INTRAVENOUS
  Filled 2014-07-18 (×6): qty 2

## 2014-07-18 MED ORDER — ARTIFICIAL TEARS OP OINT
TOPICAL_OINTMENT | OPHTHALMIC | Status: DC | PRN
  Administered 2014-07-18: 1 via OPHTHALMIC

## 2014-07-18 MED ORDER — OXYCODONE HCL ER 10 MG PO T12A
10.0000 mg | EXTENDED_RELEASE_TABLET | Freq: Two times a day (BID) | ORAL | Status: DC
  Administered 2014-07-18 – 2014-07-20 (×4): 10 mg via ORAL
  Filled 2014-07-18 (×4): qty 1

## 2014-07-18 MED ORDER — SODIUM CHLORIDE 0.9 % IR SOLN
Status: DC | PRN
  Administered 2014-07-18: 500 mL

## 2014-07-18 MED ORDER — MIDAZOLAM HCL 5 MG/5ML IJ SOLN
INTRAMUSCULAR | Status: DC | PRN
  Administered 2014-07-18 (×2): 1 mg via INTRAVENOUS

## 2014-07-18 MED ORDER — ROCURONIUM BROMIDE 100 MG/10ML IV SOLN
INTRAVENOUS | Status: DC | PRN
  Administered 2014-07-18: 40 mg via INTRAVENOUS

## 2014-07-18 MED ORDER — CEFAZOLIN SODIUM 1-5 GM-% IV SOLN
1.0000 g | Freq: Three times a day (TID) | INTRAVENOUS | Status: DC
  Administered 2014-07-18 – 2014-07-20 (×5): 1 g via INTRAVENOUS
  Filled 2014-07-18 (×9): qty 50

## 2014-07-18 MED ORDER — MIDAZOLAM HCL 2 MG/2ML IJ SOLN
1.0000 mg | INTRAMUSCULAR | Status: DC | PRN
  Administered 2014-07-18: 2 mg via INTRAVENOUS

## 2014-07-18 MED ORDER — ARTIFICIAL TEARS OP OINT
TOPICAL_OINTMENT | OPHTHALMIC | Status: AC
  Filled 2014-07-18: qty 3.5

## 2014-07-18 MED ORDER — BISACODYL 5 MG PO TBEC: 5.0000 mg | DELAYED_RELEASE_TABLET | Freq: Every day | ORAL | Status: DC | PRN

## 2014-07-18 MED ORDER — LACTATED RINGERS IV SOLN
INTRAVENOUS | Status: DC
  Administered 2014-07-18: 08:00:00 via INTRAVENOUS

## 2014-07-18 MED ORDER — FENTANYL CITRATE 0.05 MG/ML IJ SOLN
INTRAMUSCULAR | Status: DC | PRN
Start: 2014-07-18 — End: 2014-07-18
  Administered 2014-07-18 (×10): 50 ug via INTRAVENOUS

## 2014-07-18 MED ORDER — DEXAMETHASONE SODIUM PHOSPHATE 10 MG/ML IJ SOLN
INTRAMUSCULAR | Status: AC
  Filled 2014-07-18: qty 1

## 2014-07-18 MED ORDER — EPHEDRINE SULFATE 50 MG/ML IJ SOLN
INTRAMUSCULAR | Status: DC | PRN
  Administered 2014-07-18 (×2): 5 mg via INTRAVENOUS
  Administered 2014-07-18 (×2): 10 mg via INTRAVENOUS

## 2014-07-18 MED ORDER — GLYCOPYRROLATE 0.2 MG/ML IJ SOLN
INTRAMUSCULAR | Status: DC | PRN
  Administered 2014-07-18: 0.2 mg via INTRAVENOUS

## 2014-07-18 MED ORDER — ACETAMINOPHEN 325 MG PO TABS: 650.0000 mg | ORAL_TABLET | Freq: Four times a day (QID) | ORAL | Status: DC | PRN

## 2014-07-18 MED ORDER — ACETAMINOPHEN 650 MG RE SUPP: 650.0000 mg | Freq: Four times a day (QID) | RECTAL | Status: DC | PRN

## 2014-07-18 MED ORDER — PROPOFOL 10 MG/ML IV BOLUS
INTRAVENOUS | Status: DC | PRN
  Administered 2014-07-18: 150 mg via INTRAVENOUS

## 2014-07-18 MED ORDER — LIDOCAINE HCL (CARDIAC) 20 MG/ML IV SOLN
INTRAVENOUS | Status: DC | PRN
  Administered 2014-07-18: 80 mg via INTRAVENOUS

## 2014-07-18 MED ORDER — LIDOCAINE HCL (CARDIAC) 20 MG/ML IV SOLN
INTRAVENOUS | Status: AC
  Filled 2014-07-18: qty 5

## 2014-07-18 MED ORDER — METHYLENE BLUE 1 % INJ SOLN
INTRAMUSCULAR | Status: AC
  Filled 2014-07-18: qty 10

## 2014-07-18 MED ORDER — ROCURONIUM BROMIDE 50 MG/5ML IV SOLN
INTRAVENOUS | Status: AC
  Filled 2014-07-18: qty 1

## 2014-07-18 MED ORDER — HYDROMORPHONE HCL 1 MG/ML IJ SOLN
2.0000 mg | Freq: Once | INTRAMUSCULAR | Status: AC
  Administered 2014-07-18: 2 mg via INTRAVENOUS

## 2014-07-18 MED ORDER — ONDANSETRON HCL 4 MG/2ML IJ SOLN
INTRAMUSCULAR | Status: DC | PRN
  Administered 2014-07-18 (×2): 4 mg via INTRAVENOUS

## 2014-07-18 MED ORDER — 0.9 % SODIUM CHLORIDE (POUR BTL) OPTIME
TOPICAL | Status: DC | PRN
  Administered 2014-07-18 (×3): 1000 mL

## 2014-07-18 MED ORDER — TECHNETIUM TC 99M SULFUR COLLOID FILTERED
1.0000 | Freq: Once | INTRAVENOUS | Status: AC | PRN
  Administered 2014-07-18: 1 via INTRADERMAL

## 2014-07-18 MED ORDER — NEOSTIGMINE METHYLSULFATE 10 MG/10ML IV SOLN
INTRAVENOUS | Status: DC | PRN
  Administered 2014-07-18: 2 mg via INTRAVENOUS

## 2014-07-18 MED ORDER — POLYETHYLENE GLYCOL 3350 17 G PO PACK: 17.0000 g | PACK | Freq: Every day | ORAL | Status: DC | PRN

## 2014-07-18 MED ORDER — FENTANYL CITRATE 0.05 MG/ML IJ SOLN
INTRAMUSCULAR | Status: AC
  Administered 2014-07-18: 50 ug via INTRAVENOUS
  Filled 2014-07-18: qty 2

## 2014-07-18 MED ORDER — LACTATED RINGERS IV SOLN
INTRAVENOUS | Status: DC | PRN
  Administered 2014-07-18 (×3): via INTRAVENOUS

## 2014-07-18 SURGICAL SUPPLY — 94 items
ADH SKN CLS APL DERMABOND .7 (GAUZE/BANDAGES/DRESSINGS) ×1
APPLIER CLIP 9.375 MED OPEN (MISCELLANEOUS)
APR CLP MED 9.3 20 MLT OPN (MISCELLANEOUS)
BAG DECANTER FOR FLEXI CONT (MISCELLANEOUS) ×3 IMPLANT
BINDER BREAST LRG (GAUZE/BANDAGES/DRESSINGS) ×2 IMPLANT
BINDER BREAST XLRG (GAUZE/BANDAGES/DRESSINGS) IMPLANT
BIOPATCH RED 1 DISK 7.0 (GAUZE/BANDAGES/DRESSINGS) ×4 IMPLANT
BIOPATCH RED 1IN DISK 7.0MM (GAUZE/BANDAGES/DRESSINGS) ×2
BLADE 10 SAFETY STRL DISP (BLADE) ×3 IMPLANT
CANISTER SUCTION 2500CC (MISCELLANEOUS) ×6 IMPLANT
CHLORAPREP W/TINT 26ML (MISCELLANEOUS) ×6 IMPLANT
CLIP APPLIE 9.375 MED OPEN (MISCELLANEOUS) IMPLANT
CLOSURE WOUND 1/2 X4 (GAUZE/BANDAGES/DRESSINGS)
CONT SPEC 4OZ CLIKSEAL STRL BL (MISCELLANEOUS) ×5 IMPLANT
COVER PROBE W GEL 5X96 (DRAPES) ×3 IMPLANT
COVER SURGICAL LIGHT HANDLE (MISCELLANEOUS) ×6 IMPLANT
DERMABOND ADVANCED (GAUZE/BANDAGES/DRESSINGS) ×2
DERMABOND ADVANCED .7 DNX12 (GAUZE/BANDAGES/DRESSINGS) ×1 IMPLANT
DEVICE DISSECT PLASMABLAD 3.0S (MISCELLANEOUS) IMPLANT
DRAIN CHANNEL 19F RND (DRAIN) ×6 IMPLANT
DRAPE LAPAROSCOPIC ABDOMINAL (DRAPES) ×1 IMPLANT
DRAPE ORTHO SPLIT 77X108 STRL (DRAPES) ×6
DRAPE PROXIMA HALF (DRAPES) ×3 IMPLANT
DRAPE SURG 17X23 STRL (DRAPES) IMPLANT
DRAPE SURG ORHT 6 SPLT 77X108 (DRAPES) ×2 IMPLANT
DRAPE U-SHAPE 47X51 STRL (DRAPES) ×4 IMPLANT
DRAPE WARM FLUID 44X44 (DRAPE) ×3 IMPLANT
DRSG PAD ABDOMINAL 8X10 ST (GAUZE/BANDAGES/DRESSINGS) ×8 IMPLANT
ELECT BLADE 4.0 EZ CLEAN MEGAD (MISCELLANEOUS) ×6
ELECT BLADE 6.5 EXT (BLADE) ×3 IMPLANT
ELECT CAUTERY BLADE 6.4 (BLADE) ×3 IMPLANT
ELECT REM PT RETURN 9FT ADLT (ELECTROSURGICAL) ×6
ELECTRODE BLDE 4.0 EZ CLN MEGD (MISCELLANEOUS) ×2 IMPLANT
ELECTRODE REM PT RTRN 9FT ADLT (ELECTROSURGICAL) ×2 IMPLANT
EVACUATOR SILICONE 100CC (DRAIN) ×6 IMPLANT
EXPANDER BREAST 275CC (Breast) ×4 IMPLANT
GAUZE SPONGE 4X4 12PLY STRL (GAUZE/BANDAGES/DRESSINGS) ×6 IMPLANT
GLOVE BIO SURGEON STRL SZ 6.5 (GLOVE) ×2 IMPLANT
GLOVE BIO SURGEON STRL SZ7 (GLOVE) ×9 IMPLANT
GLOVE BIO SURGEON STRL SZ7.5 (GLOVE) ×2 IMPLANT
GLOVE BIO SURGEONS STRL SZ 6.5 (GLOVE) ×1
GLOVE BIOGEL PI IND STRL 6.5 (GLOVE) IMPLANT
GLOVE BIOGEL PI IND STRL 7.0 (GLOVE) IMPLANT
GLOVE BIOGEL PI IND STRL 7.5 (GLOVE) ×1 IMPLANT
GLOVE BIOGEL PI IND STRL 8 (GLOVE) IMPLANT
GLOVE BIOGEL PI INDICATOR 6.5 (GLOVE) ×4
GLOVE BIOGEL PI INDICATOR 7.0 (GLOVE) ×6
GLOVE BIOGEL PI INDICATOR 7.5 (GLOVE) ×2
GLOVE BIOGEL PI INDICATOR 8 (GLOVE) ×6
GLOVE SURG SS PI 8.0 STRL IVOR (GLOVE) ×4 IMPLANT
GOWN STRL REUS W/ TWL LRG LVL3 (GOWN DISPOSABLE) ×5 IMPLANT
GOWN STRL REUS W/TWL 2XL LVL3 (GOWN DISPOSABLE) ×4 IMPLANT
GOWN STRL REUS W/TWL LRG LVL3 (GOWN DISPOSABLE) ×21
GRAFT FLEX HD 4X16 THICK (Tissue Mesh) ×4 IMPLANT
KIT BASIN OR (CUSTOM PROCEDURE TRAY) ×6 IMPLANT
KIT ROOM TURNOVER OR (KITS) ×4 IMPLANT
LIQUID BAND (GAUZE/BANDAGES/DRESSINGS) ×4 IMPLANT
MARKER SKIN DUAL TIP RULER LAB (MISCELLANEOUS) ×3 IMPLANT
NDL 18GX1X1/2 (RX/OR ONLY) (NEEDLE) IMPLANT
NDL 21 GA WING INFUSION (NEEDLE) IMPLANT
NDL HYPO 25GX1X1/2 BEV (NEEDLE) IMPLANT
NEEDLE 18GX1X1/2 (RX/OR ONLY) (NEEDLE) IMPLANT
NEEDLE 21 GA WING INFUSION (NEEDLE) ×3 IMPLANT
NEEDLE 22X1 1/2 (OR ONLY) (NEEDLE) ×3 IMPLANT
NEEDLE HYPO 25GX1X1/2 BEV (NEEDLE) IMPLANT
NS IRRIG 1000ML POUR BTL (IV SOLUTION) ×9 IMPLANT
PACK GENERAL/GYN (CUSTOM PROCEDURE TRAY) ×6 IMPLANT
PAD ABD 8X10 STRL (GAUZE/BANDAGES/DRESSINGS) ×6 IMPLANT
PAD ARMBOARD 7.5X6 YLW CONV (MISCELLANEOUS) ×6 IMPLANT
PIN SAFETY STERILE (MISCELLANEOUS) ×6 IMPLANT
PLASMABLADE 3.0S (MISCELLANEOUS) ×3
SET ASEPTIC TRANSFER (MISCELLANEOUS) ×2 IMPLANT
SPECIMEN JAR X LARGE (MISCELLANEOUS) ×5 IMPLANT
SPONGE GAUZE 4X4 12PLY STER LF (GAUZE/BANDAGES/DRESSINGS) ×2 IMPLANT
SPONGE LAP 18X18 X RAY DECT (DISPOSABLE) ×2 IMPLANT
STAPLER VISISTAT 35W (STAPLE) ×3 IMPLANT
STRIP CLOSURE SKIN 1/2X4 (GAUZE/BANDAGES/DRESSINGS) ×1 IMPLANT
SUT ETHILON 2 0 FS 18 (SUTURE) ×1 IMPLANT
SUT MNCRL AB 4-0 PS2 18 (SUTURE) ×6 IMPLANT
SUT MON AB 3-0 SH 27 (SUTURE) ×3
SUT MON AB 3-0 SH27 (SUTURE) ×1 IMPLANT
SUT MON AB 5-0 PS2 18 (SUTURE) ×3 IMPLANT
SUT PDS AB 2-0 CT1 27 (SUTURE) ×12 IMPLANT
SUT SILK 2 0 SH (SUTURE) IMPLANT
SUT SILK 3 0 SH 30 (SUTURE) ×6 IMPLANT
SUT VIC AB 3-0 54X BRD REEL (SUTURE) ×1 IMPLANT
SUT VIC AB 3-0 BRD 54 (SUTURE) ×3
SUT VIC AB 3-0 SH 18 (SUTURE) ×3 IMPLANT
SUT VIC AB 3-0 SH 8-18 (SUTURE) ×3 IMPLANT
SYR CONTROL 10ML LL (SYRINGE) ×3 IMPLANT
TOWEL OR 17X24 6PK STRL BLUE (TOWEL DISPOSABLE) ×4 IMPLANT
TOWEL OR 17X26 10 PK STRL BLUE (TOWEL DISPOSABLE) ×4 IMPLANT
TRAY FOLEY CATH 14FRSI W/METER (CATHETERS) IMPLANT
TRAY FOLEY CATH 16FR SILVER (SET/KITS/TRAYS/PACK) ×2 IMPLANT

## 2014-07-18 NOTE — Brief Op Note (Signed)
07/18/2014  10:30 AM  PATIENT:  Debra Lowery  39 y.o. female  PRE-OPERATIVE DIAGNOSIS:  BRCA 1 POSITIVE  POST-OPERATIVE DIAGNOSIS:  BRCA 1 POSITIVE  PROCEDURE:  Procedure(s): BILATERAL NIPPLE SPARING MASTECTOMIES WITH LEFT SENTINAL LYMPH NODE BIOPSY  (Bilateral) IMMEDIATE BILATERAL BREAST RECONSTRUCTION WITH PLACEMENT OF TISSUE EXPANDER AND FLEX HD (ACELLULAR HYDRATED DERMIS) (Bilateral)  SURGEON:  Surgeon(s) and Role: Panel 1:    * Erroll Luna, MD - Primary    * Rolm Bookbinder, MD - Assisting  Panel 2:    * Theodoro Kos, DO - Primary  PHYSICIAN ASSISTANT: Shawn Rayburn, PA  ASSISTANTS: none   ANESTHESIA:   general  EBL:  Total I/O In: -  Out: 350 [Urine:350]  BLOOD ADMINISTERED:none  DRAINS: (2) Jackson-Pratt drain(s) with closed bulb suction in the breast pockets   LOCAL MEDICATIONS USED:  NONE  SPECIMEN:  No Specimen  DISPOSITION OF SPECIMEN:  N/A  COUNTS:  YES  TOURNIQUET:  * No tourniquets in log *  DICTATION: .Dragon Dictation  PLAN OF CARE: Admit to inpatient   PATIENT DISPOSITION:  PACU - hemodynamically stable.   Delay start of Pharmacological VTE agent (>24hrs) due to surgical blood loss or risk of bleeding: no

## 2014-07-18 NOTE — Transfer of Care (Signed)
Immediate Anesthesia Transfer of Care Note  Patient: Debra Lowery  Procedure(s) Performed: Procedure(s): BILATERAL NIPPLE SPARING MASTECTOMIES WITH LEFT SENTINAL LYMPH NODE BIOPSY  (Bilateral) IMMEDIATE BILATERAL BREAST RECONSTRUCTION WITH PLACEMENT OF TISSUE EXPANDER AND FLEX HD (ACELLULAR HYDRATED DERMIS) (Bilateral)  Patient Location: PACU  Anesthesia Type:General  Level of Consciousness: awake, alert  and oriented  Airway & Oxygen Therapy: Patient Spontanous Breathing and Patient connected to nasal cannula oxygen  Post-op Assessment: Report given to RN and Post -op Vital signs reviewed and stable  Post vital signs: Reviewed and stable  Last Vitals:  Filed Vitals:   07/18/14 0850  BP:   Pulse: 65  Temp:   Resp: 24    Complications: No apparent anesthesia complications

## 2014-07-18 NOTE — Progress Notes (Signed)
Report given to maryann shaver rn as caregiver 

## 2014-07-18 NOTE — Op Note (Addendum)
Op report    DATE OF OPERATION:  07/18/2014  LOCATION: [facility] - Inpatient  SURGICAL DIVISION: Plastic Surgery  PREOPERATIVE DIAGNOSES:  1. BRCA positive gene.    POSTOPERATIVE DIAGNOSES:  1. BRCA positive gene.   PROCEDURE:  1. Bilateral immediate breast reconstruction with placement of Acellular Dermal Matrix and tissue expanders.  SURGEON: Theodoro Kos, DO  ASSISTANT: Shawn Rayburn, PA  ANESTHESIA:  General.   COMPLICATIONS: None.   IMPLANTS: Left - Mentor 275 cc with  200cc normal saline in the expander Right - Mentor 275 cc with  200cc normal saline in the expander  Acellular Dermal Matrix 4 x 16 Two  INDICATIONS FOR PROCEDURE:  The patient, Debra Lowery, is a 39 y.o. female born on Dec 19, 1975, is here for  immediate first stage breast reconstruction with placement of bilateral tissue expander and Acellular dermal matrix. MRN: [id number]  CONSENT:  Informed consent was obtained directly from the patient. Risks, benefits and alternatives were fully discussed. Specific risks including but not limited to bleeding, infection, hematoma, seroma, scarring, pain, implant infection, implant extrusion, capsular contracture, asymmetry, wound healing problems, and need for further surgery were all discussed. The patient did have an ample opportunity to have her questions answered to her satisfaction.   DESCRIPTION OF PROCEDURE:  The patient was taken to the operating room by the general surgery team. SCDs were placed and IV antibiotics were given. The patient's chest was prepped and draped in a sterile fashion. A time out was performed and the implants to be used were identified.  Bilateral mastectomies were performed.  Once the general surgery team had completed their portion of the case the patient was rendered to the plastic and reconstructive surgery team.  The same procedure was done on both sides.  The pectoralis major muscle was lifted from the chest wall with  release of the lateral edge and lateral inframammary fold.  The pocket was irrigated with antibiotic solution and hemostasis was achieved with electrocautery.  The ADM was then prepared according to the manufacture guidelines and slits placed to help with postoperative fluid management.  The ADM was then sutured to the inferior and lateral edge of the inframammary fold with 2-0 PDS starting with an interrupted stitch and then a running stitch.  The lateral portion was sutured to with interrupted sutures after the expander was placed.  The expander was prepared according to the manufacture guidelines, the air evacuated and then it was placed under the ADM and pectoralis major muscle.  The inferior and lateral tabs were used to secure the expander to the chest wall with 2-0 PDS.  The drain was placed at the inframammary fold over the ADM and secured to the skin with 3-0 Silk.    The deep layers were closed with 3-0 Vicryl followed by 4-0 Monocryl.  The skin was closed with 5-0 Monocryl and then dermabond was applied.  The ABDs and breast binder were placed.  The patient tolerated the procedure well and there were no complications.  The patient was allowed to wake from anesthesia and taken to the recovery room in satisfactory condition.

## 2014-07-18 NOTE — Anesthesia Procedure Notes (Addendum)
Anesthesia Regional Block:  Pectoralis block  Pre-Anesthetic Checklist: ,, timeout performed, Correct Patient, Correct Site, Correct Laterality, Correct Procedure, Correct Position, site marked, Risks and benefits discussed,  Surgical consent,  Pre-op evaluation,  At surgeon's request and post-op pain management  Laterality: Left and Right  Prep: Maximum Sterile Barrier Precautions used and chloraprep       Needles:  Injection technique: Single-shot  Needle Type: Echogenic Stimulator Needle     Needle Length: 10cm 10 cm Needle Gauge: 21 and 21 G    Additional Needles:  Procedures: ultrasound guided (picture in chart) Pectoralis block Narrative:  Start time: 07/18/2014 8:12 AM End time: 07/18/2014 8:27 AM Injection made incrementally with aspirations every 5 mL.  Performed by: Personally  Anesthesiologist: Alexis Frock  Additional Notes: Left and Right PECS II blocks done.  Sterile prep, US guidance, talked to patient throughout, total of 93ml of .25% marcaine plain used on each side.  29ml between PEC minor and seratus and 76ml between pec minor and Pec major, patient tolerated procedure well, no complications   Procedure Name: Intubation Date/Time: 07/18/2014 9:15 AM Performed by: Susa Loffler Pre-anesthesia Checklist: Patient identified, Timeout performed, Emergency Drugs available, Suction available and Patient being monitored Patient Re-evaluated:Patient Re-evaluated prior to inductionOxygen Delivery Method: Circle system utilized Preoxygenation: Pre-oxygenation with 100% oxygen Intubation Type: IV induction Ventilation: Mask ventilation without difficulty Laryngoscope Size: Mac and 3 Grade View: Grade I Tube type: Oral Tube size: 7.0 mm Number of attempts: 1 Airway Equipment and Method: Stylet Placement Confirmation: ETT inserted through vocal cords under direct vision,  positive ETCO2 and breath sounds checked- equal and bilateral Secured at: 22 cm Tube  secured with: Tape Dental Injury: Teeth and Oropharynx as per pre-operative assessment

## 2014-07-18 NOTE — H&P (Addendum)
H&P   Debra Lowery (MR# [id number])      H&P Info    Author Note Status Last Update User Last Update Date/Time   Erroll Luna, MD Signed Erroll Luna, MD 03/14/2014 5:12 PM    H&P    Expand All Collapse All    [name] 03/14/2014 9:27 AM Location: [facility] Patient #: [id number] DOB: [birth year] Married / Language: Debra Lowery / Race: White Female History of Present Illness [name] [name] MD; 03/14/2014 5:01 PM) Patient words: pre-op visit breast   Patient sent at the request of Dr. [name] . Patient is BRCA 1 positive after genetic counseling consultation. Both her mother and grandmother have had breast cancer which prompted the evaluation. She is here to discuss options from the standpoint of her breast. She would like to discuss mastectomy as well as nonoperative options of dealing with this. Denies any history of breast pain, nipple discharge or breast mass. Last mammogram was 6 months ago and was normal. She will eventually require hysterectomy and removal of ovaries at some point she tells me.  The patient is a 39 year old female Pt underwent MRI as a screen and showed a 1,8 cm left breat mass path benign but mass concerning.  Allergies [facility] [place], [place]; 03/14/2014 9:28 AM) No Known Drug Allergies 03/12/2014  Medication History ([name], RMA; 03/14/2014 9:29 AM) Silymarin (Oral) Active. Acidophilus (Oral) Active. Medications Reconciled  Family History ([place], [place]; 03/14/2014 9:27 AM) Diabetes Mellitus Father. Hypertension Father.  Pregnancy / Birth History Debra Lowery [place], [place]; 03/14/2014 9:27 AM) Contraceptive History Oral contraceptives.     Review of Systems ([name]; 03/14/2014 9:27 AM) Gastrointestinal Not Present- Abdominal Pain, Bloating, Bloody Stool, Change in Bowel Habits, Chronic diarrhea, Constipation, Difficulty Swallowing, Excessive gas,  Gets full quickly at meals, Hemorrhoids, Indigestion, Nausea, Rectal Pain and Vomiting. Female Genitourinary Not Present- Frequency, Nocturia, Painful Urination, Pelvic Pain and Urgency. Neurological Not Present- Decreased Memory, Fainting, Headaches, Numbness, Seizures, Tingling, Tremor, Trouble walking and Weakness.  Vitals ([name] RMA; 03/14/2014 9:27 AM) 03/14/2014 9:27 AM Weight: 155.2 lb Height: 64in Body Surface Area: 1.78 m Body Mass Index: 26.64 kg/m Temp.: 97.70F(Oral)  Pulse: 61 (Regular)  P.OX: 98% (Room air) BP: 118/60 (Sitting, Right Arm, Standard)     Physical Exam ([name] A. Kairee Isa MD; 03/14/2014 5:02 PM)  General Mental Status-Alert. General Appearance-Consistent with stated age. Hydration-Well hydrated. Voice-Normal.  Head and Neck Head-normocephalic, atraumatic with no lesions or palpable masses. Trachea-midline. Thyroid Gland Characteristics - normal size and consistency.  Eye Eyeball - Bilateral-Extraocular movements intact. Sclera/Conjunctiva - Bilateral-No scleral icterus.  Chest and Lung Exam Chest and lung exam reveals -quiet, even and easy respiratory effort with no use of accessory muscles and on auscultation, normal breath sounds, no adventitious sounds and normal vocal resonance. Inspection Chest Wall - Normal. Back - normal.  Breast Breast - Left-Symmetric, Non Tender, No Biopsy scars, no Dimpling, No Inflammation, No Lumpectomy scars, No Mastectomy scars, No Peau d' Orange. Breast - Right-Symmetric, Non Tender, No Biopsy scars, no Dimpling, No Inflammation, No Lumpectomy scars, No Mastectomy scars, No Peau d' Orange. Breast Lump-No Palpable Breast Mass.  Cardiovascular Cardiovascular examination reveals -normal heart sounds, regular rate and rhythm with no murmurs and normal pedal pulses bilaterally.  Abdomen Inspection Inspection of the abdomen reveals - No Hernias. Skin - Scar - no  surgical scars. Palpation/Percussion Palpation and Percussion of the abdomen reveal - Soft, Non Tender, No Rebound tenderness, No Rigidity (guarding) and  No hepatosplenomegaly. Auscultation Auscultation of the abdomen reveals - Bowel sounds normal.  Neurologic Neurologic evaluation reveals -alert and oriented x 3 with no impairment of recent or remote memory. Mental Status-Normal.  Musculoskeletal Normal Exam - Left-Upper Extremity Strength Normal and Lower Extremity Strength Normal. Normal Exam - Right-Upper Extremity Strength Normal and Lower Extremity Strength Normal.  Lymphatic Head & Neck  General Head & Neck Lymphatics: Bilateral - Description - Normal. Axillary  General Axillary Region: Bilateral - Description - Normal. Tenderness - Non Tender. Femoral & Inguinal  Generalized Femoral & Inguinal Lymphatics: Bilateral - Description - Normal. Tenderness - Non Tender. CLINICAL DATA: BRCA 1 positive patient. High risk screening evaluation.  EXAM: BILATERAL BREAST MRI WITH AND WITHOUT CONTRAST  TECHNIQUE: Multiplanar, multisequence MR images of both breasts were obtained prior to and following the intravenous administration of 41m of MultiHance  THREE-DIMENSIONAL MR IMAGE RENDERING ON INDEPENDENT WORKSTATION:  Three-dimensional MR images were rendered by post-processing of the original MR data on an independent workstation. The three-dimensional MR images were interpreted, and findings are reported in the following complete MRI report for this study. Three dimensional images were evaluated at the independent DynaCad workstation  COMPARISON: Mammograms dated 11/19/2013, 03/16/2013, 05/07/2011.  FINDINGS: Breast composition: b. Scattered fibroglandular tissue.  Background parenchymal enhancement: Mild  Right breast: No mass or abnormal enhancement.  Left breast: There is an irregular enhancing mass located within the posterior 1/3 of the upper  inner quadrant of the left breast which measures 1.8 x 1.1 x 0.9 cm in size. This is associated with a mixture plateau and washout enhancement kinetics. Tissue sampling is recommended. Left breast diagnostic mammogram and ultrasound is recommended for further evaluation. There are no additional worrisome enhancing foci within the left breast.  Lymph nodes: No abnormal appearing lymph nodes.  Ancillary findings: None.  IMPRESSION: 1.8 cm suspicious enhancing mass located within the posterior 1/3 of the upper inner quadrant of the left breast. Recommend left breast diagnostic mammogram with ultrasound at this time with a left breast ultrasound-guided core biopsy if there is an ultrasound correlate. If there is no ultrasound correlate, recommend MR guided biopsy.  RECOMMENDATION: Left breast diagnostic mammogram and ultrasound with possible left breast ultrasound-guided core biopsy as discussed above.  BI-RADS CATEGORY 4: Suspicious.   Electronically Signed  By: RLuberta RobertsonM.D.  On: 05/15/2014 14:57    Assessment & Plan (Marcello MooresA. Miski Feldpausch MD; 03/14/2014 5:07 PM)  BRCA1 POSITIVE (V84.01  Z15.01) Impression: discussed options of bilateral mastectomy versus surveillance with MRI. Discussed nipple preservation given that she appears to be an ideal candidate. Referral to plastic surgery to discuss reconstruction. discussed utility of sentinel lymph node mapping. Would obtain MRI prior to mastectomy given density of breast on exam. She will call to schedule.  Current Plans Mass on MRI benign but may be discordant.  Will plan SLN mapping on left      BRCA1/BRCA2 FULL SEQUENCE ANALYSIS (81211) MASTECTOMY, NIPPLE SPARING ([id number] (Procedure discussed with patient. Risk of bleeding, infection, nipple loss, lymphedema, need for other operative procedures, numbness across skin, and cosmetic deformity.) Referred to Surgery ? Plastic, for evaluation and follow up (Plastic  Surgery).

## 2014-07-18 NOTE — Op Note (Signed)
Bilateral nipple sparing  Mastectomy  With left sentinel lymph node mapping Procedure Note  Indications: This patient presents for surgical treatment of BRCA 1 mutation and strong family history of  Breast cancer.  She desires bilateral nipple sparing mastectomy and left sentinel lymph node mapping due to mass on left that was biopsied and found to be benign. The surgical and non surgical options have been discussed with the patient.  Risks of surgery include bleeding,  Infection,  Flap necrosis,   Nipple loss Tissue loss,  Chronic pain, death, Numbness,  And the need for additional procedures.  Reconstruction options also have been discussed with the patient as well.  The patient agrees to proceed..Sentinel lymph node mapping and dissection has been discussed with the patient.  Risk of bleeding,  Infection,  Seroma formation,  Additional procedures,,  Shoulder weakness ,  Shoulder stiffness,  Nerve and blood vessel injury and reaction to the mapping dyes have been discussed.  Alternatives to surgery have been discussed with the patient.  The patient agrees to proceed.  Pre-operative Diagnosis:BRCA 1 positive and left breast mass Post-operative Diagnosis: same  Surgeon: Erroll Luna A.   Assistants: Dr Serita Grammes MD  Anesthesia: General endotracheal anesthesia and TAP block   ASA Class: 1  Procedure Details  The patient was seen again in the Holding Room. The risks, benefits, indications, potential complications, treatment options, and expected outcomes were discussed with the patient. The possibilities of reaction to medication, pulmonary aspiration, bleeding, recurrent infection, the need for additional procedures, failure to diagnose a condition, and creating a complication requiring transfusion or further operation were discussed with the patient. The patient and/or family concurred with the proposed plan, giving informed consent. The site of surgery was properly noted/marked. Pt underwent  nuclear medicine injection of left breast in holding area.  The patient was taken to the Operating Room, identified as Debra Lowery and the procedure verified as bilateral nipple sparing mastectomy and left SLN mapping. . A Time Out was held and the above information confirmed.  The patient was placed supine and general anesthetic was administered. The both arms, both breasts, and chest were prepped and draped in standard fashion. An oblique e incision was made in the inferior mammary crease on the right. . Skin flaps were created meticulously to preserve the subdermal blood supply . Dissection was carried down to the pectoralis fascia.  The breast was mobilized off the pectoralis major to the clavicle.  Skin flaps created anteriorly and facelift scissors used to dissect below the nipple.  Biopsy of nipple taken. . Small venous tributaries, lymphatics, and vessels were clipped and ligated or cauterized and divided. The specimen was removed and marked.  The skin and nipple appeared viable.   In a similar fashion the left breast was removed by creating an incision along the inferior mammary fold.  The breast was elevated off the chest to the clavicle.  Anterior skin flaps were created and the left nipple biopsied.  The breast was removed and marked.  Neoprobe used in the left axilla and 2 SLN  identified and removed.  Hemostasis achieved.  Irrigation used on both sides.  Dr Migdalia Dk scrubbed in to complete the reconstruction.   All counts correct and pt stable at this point.  Please see her note for her part of the procedure.     Instrument, sponge, and needle counts were correct at closure and at the conclusion of the case.    Estimated Blood Loss: less than 50  mL                  Total IV Fluids: 1200 mL         Specimens:  Both Breast with and 2 SLN    Complications: None; patient tolerated the procedure well.         Disposition: PACU - hemodynamically stable.         Condition:  stable

## 2014-07-18 NOTE — Interval H&P Note (Signed)
History and Physical Interval Note:  07/18/2014 8:20 AM  Debra Lowery  has presented today for surgery, with the diagnosis of BRCA 1  The various methods of treatment have been discussed with the patient and family. After consideration of risks, benefits and other options for treatment, the patient has consented to  Procedure(s): BILATERAL NIPPLE SPARING MASTECTOMIES WITH LEFT SENTINAL LYMPH NODE BIOPSY  (Bilateral) IMMEDIATE BILATERAL BREAST RECONSTRUCTION WITH PLACEMENT OF TISSUE EXPANDER AND FLEX HD (ACELLULAR HYDRATED DERMIS) (Bilateral) as a surgical intervention .  The patient's history has been reviewed, patient examined, no change in status, stable for surgery.  I have reviewed the patient's chart and labs.  Questions were answered to the patient's satisfaction.     [name] A.

## 2014-07-18 NOTE — Progress Notes (Signed)
Care of pt assumed by MA Machael Raine RN from S. Gregson RN 

## 2014-07-19 MED ORDER — OXYCODONE HCL 5 MG PO TABS
5.0000 mg | ORAL_TABLET | ORAL | Status: DC | PRN
  Administered 2014-07-19: 5 mg via ORAL
  Administered 2014-07-19 – 2014-07-20 (×2): 15 mg via ORAL
  Administered 2014-07-20: 10 mg via ORAL
  Filled 2014-07-19: qty 3
  Filled 2014-07-19: qty 2
  Filled 2014-07-19 (×2): qty 3

## 2014-07-19 NOTE — Progress Notes (Signed)
1 Day Post-Op  Subjective: Pain control issues overnight. She had IV Dilaudid and this helped some. She has been up to BR.  Bilateral breast flaps and NAC viable and without signs of hematoma.   Objective: Vital signs in last 24 hours: Temp:  [97.7 F (36.5 C)-99.9 F (37.7 C)] 97.9 F (36.6 C) (02/05 0452) Pulse Rate:  [61-92] 61 (02/05 0452) Resp:  [10-20] 20 (02/05 0452) BP: (89-130)/(43-64) 89/43 mmHg (02/05 0452) SpO2:  [97 %-100 %] 97 % (02/05 0452) Weight:  [70.217 kg (154 lb 12.8 oz)] 70.217 kg (154 lb 12.8 oz) (02/04 1536) Last BM Date: 07/18/14  Intake/Output from previous day: 02/04 0701 - 02/05 0700 In: 2557.1 [P.O.:30; I.V.:2527.1] Out: 2534 [Urine:2050; Drains:334; Blood:150] Intake/Output this shift:    General appearance: alert, cooperative, appears stated age and moderate distress Resp: clear to auscultation bilaterally Cardio: regular rate and rhythm Bilateral breast incisions clean, dry and intact and without signs of infection or hematoma. NAC viable. JP drainage as expected  Lab Results:   Recent Labs  07/18/14 1650  WBC 13.7*  HGB 12.0  HCT 36.5  PLT 248   BMET  Recent Labs  07/18/14 1650  NA 139  K 3.6  CL 106  CO2 27  GLUCOSE 165*  BUN 11  CREATININE 0.83  CALCIUM 8.6   PT/INR No results for input(s): LABPROT, INR in the last 72 hours. ABG No results for input(s): PHART, HCO3 in the last 72 hours.  Invalid input(s): PCO2, PO2  Studies/Results: Nm Sentinel Node Inj-no Rpt (breast)  07/18/2014   CLINICAL DATA: left breast mass BRCA 1   Sulfur colloid was injected intradermally by the nuclear medicine  technologist for breast cancer sentinel node localization.     Anti-infectives: Anti-infectives    Start     Dose/Rate Route Frequency Ordered Stop   07/18/14 2000  ceFAZolin (ANCEF) IVPB 1 g/50 mL premix     1 g100 mL/hr over 30 Minutes Intravenous 3 times per day 07/18/14 1526     07/18/14 1218  polymyxin B 500,000 Units,  bacitracin 50,000 Units in sodium chloride irrigation 0.9 % 500 mL irrigation  Status:  Discontinued       As needed 07/18/14 1218 07/18/14 1339   07/18/14 0600  ceFAZolin (ANCEF) 3 g in dextrose 5 % 50 mL IVPB     3 g160 mL/hr over 30 Minutes Intravenous On call to O.R. 07/17/14 1350 07/18/14 1315      Assessment/Plan: s/p Procedure(s): BILATERAL NIPPLE SPARING MASTECTOMIES WITH LEFT SENTINAL LYMPH NODE BIOPSY  (Bilateral) IMMEDIATE BILATERAL BREAST RECONSTRUCTION WITH PLACEMENT OF TISSUE EXPANDER AND FLEX HD (ACELLULAR HYDRATED DERMIS) (Bilateral) Work on pain control  Mobilize more Hopefully home tomorrow if doing well.   LOS: 1 day    [facility] Surgery [phone]

## 2014-07-19 NOTE — Progress Notes (Signed)
1 Day Post-Op  Subjective: Pain an issues   Objective: Vital signs in last 24 hours: Temp:  [97.7 F (36.5 C)-99.9 F (37.7 C)] 97.9 F (36.6 C) (02/05 0452) Pulse Rate:  [54-92] 61 (02/05 0452) Resp:  [10-24] 20 (02/05 0452) BP: (89-130)/(43-79) 89/43 mmHg (02/05 0452) SpO2:  [97 %-100 %] 97 % (02/05 0452) Weight:  [148 lb (67.132 kg)-154 lb 12.8 oz (70.217 kg)] 154 lb 12.8 oz (70.217 kg) (02/04 1536) Last BM Date: 07/18/14  Intake/Output from previous day: 02/04 0701 - 02/05 0700 In: 2557.1 [P.O.:30; I.V.:2527.1] Out: 2534 [Urine:2050; Drains:334; Blood:150] Intake/Output this shift:    Incision/Wound:skin flaps ok  Serous drainage no hematoma  Lab Results:   Recent Labs  07/18/14 1650  WBC 13.7*  HGB 12.0  HCT 36.5  PLT 248   BMET  Recent Labs  07/18/14 1650  NA 139  K 3.6  CL 106  CO2 27  GLUCOSE 165*  BUN 11  CREATININE 0.83  CALCIUM 8.6   PT/INR No results for input(s): LABPROT, INR in the last 72 hours. ABG No results for input(s): PHART, HCO3 in the last 72 hours.  Invalid input(s): PCO2, PO2  Studies/Results: Nm Sentinel Node Inj-no Rpt (breast)  07/18/2014   CLINICAL DATA: left breast mass BRCA 1   Sulfur colloid was injected intradermally by the nuclear medicine  technologist for breast cancer sentinel node localization.     Anti-infectives: Anti-infectives    Start     Dose/Rate Route Frequency Ordered Stop   07/18/14 2000  ceFAZolin (ANCEF) IVPB 1 g/50 mL premix     1 g100 mL/hr over 30 Minutes Intravenous 3 times per day 07/18/14 1526     07/18/14 1218  polymyxin B 500,000 Units, bacitracin 50,000 Units in sodium chloride irrigation 0.9 % 500 mL irrigation  Status:  Discontinued       As needed 07/18/14 1218 07/18/14 1339   07/18/14 0600  ceFAZolin (ANCEF) 3 g in dextrose 5 % 50 mL IVPB     3 g160 mL/hr over 30 Minutes Intravenous On call to O.R. 07/17/14 1350 07/18/14 1315      Assessment/Plan: s/p Procedure(s): BILATERAL  NIPPLE SPARING MASTECTOMIES WITH LEFT SENTINAL LYMPH NODE BIOPSY  (Bilateral) IMMEDIATE BILATERAL BREAST RECONSTRUCTION WITH PLACEMENT OF TISSUE EXPANDER AND FLEX HD (ACELLULAR HYDRATED DERMIS) (Bilateral) Pain an issue Flaps ok  No hematoma Per Dr Migdalia Dk   LOS: 1 day    Trevious Rampey A. 07/19/2014

## 2014-07-20 MED ORDER — OXYCODONE HCL ER 10 MG PO T12A: 10.0000 mg | EXTENDED_RELEASE_TABLET | Freq: Two times a day (BID) | ORAL | Status: DC

## 2014-07-20 MED ORDER — DIPHENHYDRAMINE HCL 25 MG PO CAPS: 25.0000 mg | ORAL_CAPSULE | Freq: Four times a day (QID) | ORAL | Status: DC | PRN

## 2014-07-20 MED ORDER — POLYETHYLENE GLYCOL 3350 17 G PO PACK: 17.0000 g | PACK | Freq: Every day | ORAL | Status: DC | PRN

## 2014-07-20 MED ORDER — BISACODYL 5 MG PO TBEC: 5.0000 mg | DELAYED_RELEASE_TABLET | Freq: Every day | ORAL | Status: DC | PRN

## 2014-07-20 MED ORDER — DIAZEPAM 2 MG PO TABS: 2.0000 mg | ORAL_TABLET | Freq: Two times a day (BID) | ORAL | Status: DC | PRN

## 2014-07-20 MED ORDER — OXYCODONE HCL 5 MG PO TABS: 5.0000 mg | ORAL_TABLET | ORAL | Status: DC | PRN

## 2014-07-20 MED ORDER — ONDANSETRON HCL 4 MG PO TABS: 4.0000 mg | ORAL_TABLET | Freq: Three times a day (TID) | ORAL | Status: DC | PRN

## 2014-07-20 NOTE — Progress Notes (Signed)
Reviewed breast cancer bag contents with pt and pillows.

## 2014-07-20 NOTE — Plan of Care (Signed)
Problem: Discharge Progression Outcomes Goal: Drain functioning/secure Outcome: Completed/Met Date Met:  07/20/14 Pt demonstrates emptying of JP drains and accurate measurement and recording.  Pt has JP record chart to take home and accurate readable measurement container.

## 2014-07-20 NOTE — Progress Notes (Signed)
2 Days Post-Op  Subjective: Still having a lot of pain control issues.  Have removed 50 cc of saline from each expander this morning and she seems more comfortable already. She would like to try and go home later today if her pain remains well controlled. She now has 150/275 cc saline in each expander.  Bilateral breast flaps and NAC are viable and without signs of hematoma or infection.   Objective: Vital signs in last 24 hours: Temp:  [98.2 F (36.8 C)-98.3 F (36.8 C)] 98.2 F (36.8 C) (02/06 0556) Pulse Rate:  [66-67] 66 (02/06 0556) Resp:  [17-19] 19 (02/06 0556) BP: (95-100)/(35-46) 100/35 mmHg (02/06 0556) SpO2:  [99 %-100 %] 99 % (02/06 0556) Last BM Date: 07/18/14  Intake/Output from previous day: 02/05 0701 - 02/06 0700 In: 3609.2 [P.O.:800; I.V.:2609.2; IV Piggyback:200] Out: 1515 [Urine:1250; Drains:265] Intake/Output this shift: Total I/O In: -  Out: 700 [Urine:700]  General appearance: alert, cooperative, appears stated age and mild distress Resp: clear to auscultation bilaterally Cardio: regular rate and rhythm Bilateral breast flaps and NAC are viable and without signs of hematoma or infection.   Lab Results:   Recent Labs  07/18/14 1650  WBC 13.7*  HGB 12.0  HCT 36.5  PLT 248   BMET  Recent Labs  07/18/14 1650  NA 139  K 3.6  CL 106  CO2 27  GLUCOSE 165*  BUN 11  CREATININE 0.83  CALCIUM 8.6   PT/INR No results for input(s): LABPROT, INR in the last 72 hours. ABG No results for input(s): PHART, HCO3 in the last 72 hours.  Invalid input(s): PCO2, PO2  Studies/Results: No results found.  Anti-infectives: Anti-infectives    Start     Dose/Rate Route Frequency Ordered Stop   07/18/14 2000  ceFAZolin (ANCEF) IVPB 1 g/50 mL premix     1 g100 mL/hr over 30 Minutes Intravenous 3 times per day 07/18/14 1526     07/18/14 1218  polymyxin B 500,000 Units, bacitracin 50,000 Units in sodium chloride irrigation 0.9 % 500 mL irrigation  Status:   Discontinued       As needed 07/18/14 1218 07/18/14 1339   07/18/14 0600  ceFAZolin (ANCEF) 3 g in dextrose 5 % 50 mL IVPB     3 g160 mL/hr over 30 Minutes Intravenous On call to O.R. 07/17/14 1350 07/18/14 1315      Assessment/Plan: s/p Procedure(s): BILATERAL NIPPLE SPARING MASTECTOMIES WITH LEFT SENTINAL LYMPH NODE BIOPSY  (Bilateral) IMMEDIATE BILATERAL BREAST RECONSTRUCTION WITH PLACEMENT OF TISSUE EXPANDER AND FLEX HD (ACELLULAR HYDRATED DERMIS) (Bilateral) Plan for discharge later today if pain adequately controlled with po medications.   LOS: 2 days    Mikaya Bunner,PA-C Plastic Surgery [phone]

## 2014-07-20 NOTE — Progress Notes (Signed)
2 Days Post-Op  Subjective: Stable and alert. Tolerating diet. Ambulated to bathroom. Significant pain. Plastics considering removing some fluid from expanders.  Objective: Vital signs in last 24 hours: Temp:  [98.2 F (36.8 C)-98.3 F (36.8 C)] 98.2 F (36.8 C) (02/06 0556) Pulse Rate:  [66-67] 66 (02/06 0556) Resp:  [17-19] 19 (02/06 0556) BP: (95-100)/(35-46) 100/35 mmHg (02/06 0556) SpO2:  [99 %-100 %] 99 % (02/06 0556) Last BM Date: 07/18/14  Intake/Output from previous day: 02/05 0701 - 02/06 0700 In: 3609.2 [P.O.:800; I.V.:2609.2; IV Piggyback:200] Out: 1515 [Urine:1250; Drains:265] Intake/Output this shift: Total I/O In: -  Out: 700 [Urine:700]  General appearance: Alert. Mild distress. Cooperative. Sitting up in bed. Chest wall: no tenderness, Mastectomy flaps viable. No hematoma.  Lab Results:  No results found for this or any previous visit (from the past 24 hour(s)).   Studies/Results: No results found.  Marland Kitchen  ceFAZolin (ANCEF) IV  1 g Intravenous 3 times per day  . OxyCODONE  10 mg Oral Q12H     Assessment/Plan: s/p Procedure(s): BILATERAL NIPPLE SPARING MASTECTOMIES WITH LEFT SENTINAL LYMPH NODE BIOPSY  IMMEDIATE BILATERAL BREAST RECONSTRUCTION WITH PLACEMENT OF TISSUE EXPANDER AND FLEX HD (ACELLULAR HYDRATED DERMIS)  POD #2.  stable. Still with pain Flaps viable.   disposition per Dr. Migdalia Dk.  @PROBHOSP @  LOS: 2 days    Makai Dumond M 07/20/2014  . .prob

## 2014-07-20 NOTE — Progress Notes (Signed)
Pt discharged to home accomp by mother.  All discharge instructions given and explained to pt.  Pt has her breast cancer bag with JP measuring container in it.  Rx's given and explained.  Arm precautions reviewed with pt about left arm.  Support group info given in case pt needs that.  Wound care, JP care, diet and FU appts all reviewed and pt has a full understanding of home self care.

## 2014-07-22 ENCOUNTER — Encounter (HOSPITAL_COMMUNITY): Payer: Self-pay | Admitting: Surgery

## 2014-07-22 NOTE — Discharge Summary (Signed)
Physician Discharge Summary  Patient ID: YNEZ EUGENIO MRN: [id number] DOB/AGE: 40-07-1975 41 y.o.  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  She desires bilateral nipple sparing mastectomy and left sentinel lymph node mapping due to mass on left that was biopsied and found to be benign. The surgical and non surgical options had been discussed with the patient and she desired to proceed with bilateral NAC sparing mastectomies. She was taken to the OR for the procedure with  Immediate reconstruction following with bilateral tissue expanders and ADM placement.  Post operatively, she had pain control issues and 50 cc of saline was removed from each expander for her comfort and she improved and was discharged home on POD#2. She will follow up with Dr. Migdalia Dk next week. She now has 150/275 cc in each expander at the time of discharge.   Consults: None  Significant Diagnostic Studies: Pathology pending  Treatments: surgery: Bilateral nipple sparing mastectomies with left sentinel lymph node biopsy per Dr Brantley Stage and immediate reconstruction with placement of bilateral 275 cc tissue expanders and ADM bilaterally per Dr. Migdalia Dk.  Discharge Exam: Blood pressure 92/42, pulse 59, temperature 98 F (36.7 C), temperature source Oral, resp. rate 16, height 5' 4"  (1.626 m), weight 70.217 kg (154 lb 12.8 oz), last menstrual period 07/17/2014, SpO2 100 %. General appearance: alert, cooperative, appears stated age and mild distress Resp: clear to auscultation bilaterally Cardio: regular rate and rhythm Incision/Wound: Bilateral breast flaps and NAC are viable and without signs of  hematoma or infection.  The incisions are clean, dry and intact.   Disposition: 01-Home or Self Care  Discharge Instructions    Care order/instruction    Complete by:  As directed   Provide patient with drain record sheet and instruct in drain care            Medication List    TAKE these medications        bisacodyl 5 MG EC tablet  Commonly known as:  DULCOLAX  Take 1 tablet (5 mg total) by mouth daily as needed for moderate constipation.     diazepam 2 MG tablet  Commonly known as:  VALIUM  Take 1 tablet (2 mg total) by mouth every 12 (twelve) hours as needed for muscle spasms.     diphenhydrAMINE 25 mg capsule  Commonly known as:  BENADRYL  Take 1 capsule (25 mg total) by mouth every 6 (six) hours as needed for itching.     ondansetron 4 MG tablet  Commonly known as:  ZOFRAN  Take 1 tablet (4 mg total) by mouth every 8 (eight) hours as needed for nausea or vomiting.     oxyCODONE 5 MG immediate release tablet  Commonly known as:  Oxy IR/ROXICODONE  Take 1-3 tablets (5-15 mg total) by mouth every 4 (four) hours as needed for moderate pain or severe pain (Use 5 mg prn for milder pain, 10 mg prn for moderate pain and 5m prn for more severe pain).     OxyCODONE 10 mg T12a 12 hr tablet  Commonly known as:  OXYCONTIN  Take 1 tablet (10 mg total) by mouth every 12 (twelve) hours.     polyethylene glycol packet  Commonly known as:  MIRALAX / GLYCOLAX  Take 17 g by mouth daily as needed for mild constipation.           Follow-up Information    Follow up with [facility], DO In 1 week.   Specialty:  Plastic Surgery   Contact information:   [place] [address] [phone]       Follow up with CORNETT,THOMAS A., MD In 2 weeks.   Specialty:  General Surgery   Contact information:   [address] [place] [address] [phone]       Signed: [facility] Plastic [facility] [phone]

## 2014-07-23 NOTE — Anesthesia Postprocedure Evaluation (Signed)
  Anesthesia Post-op Note  Patient: Debra Lowery  Procedure(s) Performed: Procedure(s) (LRB): BILATERAL NIPPLE SPARING MASTECTOMIES WITH LEFT SENTINAL LYMPH NODE BIOPSY  (Bilateral) IMMEDIATE BILATERAL BREAST RECONSTRUCTION WITH PLACEMENT OF TISSUE EXPANDER AND FLEX HD (ACELLULAR HYDRATED DERMIS) (Bilateral)  Patient Location: PACU  Anesthesia Type: General  Level of Consciousness: awake and alert   Airway and Oxygen Therapy: Patient Spontanous Breathing  Post-op Pain: mild  Post-op Assessment: Post-op Vital signs reviewed, Patient's Cardiovascular Status Stable, Respiratory Function Stable, Patent Airway and No signs of Nausea or vomiting  Last Vitals:  Filed Vitals:   07/20/14 1312  BP: 92/42  Pulse: 59  Temp: 36.7 C  Resp: 16    Post-op Vital Signs: stable   Complications: No apparent anesthesia complications

## 2014-09-24 ENCOUNTER — Other Ambulatory Visit: Payer: Self-pay | Admitting: Plastic Surgery

## 2014-09-24 DIAGNOSIS — Z9013 Acquired absence of bilateral breasts and nipples: Secondary | ICD-10-CM

## 2014-10-07 NOTE — Pre-Procedure Instructions (Signed)
Debra Lowery  10/07/2014   Your procedure is scheduled on:  Wednesday, May 4.  Report to [facility] Admitting at 10:15AM.   Call this number if you have problems the morning of surgery: [phone]                 For any other questions, please call [phone], Monday - Friday 8 AM - 4 PM.   Remember:   Do not eat food or drink liquids after midnight Tuesday, May 3.   Take these medicines the morning of surgery with A SIP OF WATER: None.   Do not wear jewelry, make-up or nail polish.  Do not wear lotions, powders, or perfumes.   Do not shave 48 hours prior to surgery.   Do not bring valuables to the hospital.              [facility] is not responsible for any belongings or valuables.               Contacts, dentures or bridgework may not be worn into surgery.  Leave suitcase in the car. After surgery it may be brought to your room.  For patients admitted to the hospital, discharge time is determined by your treatment team.               Patients discharged the day of surgery will not be allowed to drive home.  Name and phone number of your driver: -   Special Instructions: -   Please read over the following fact sheets that you were given: Pain Booklet, Coughing and Deep Breathing and Surgical Site Infection Prevention

## 2014-10-08 ENCOUNTER — Encounter (HOSPITAL_COMMUNITY)
Admission: RE | Admit: 2014-10-08 | Discharge: 2014-10-08 | Disposition: A | Discharge status: Home / Self Care | Payer: BLUE CROSS/BLUE SHIELD | Source: Ambulatory Visit | Attending: Plastic Surgery | Admitting: Plastic Surgery

## 2014-10-08 ENCOUNTER — Encounter (HOSPITAL_COMMUNITY): Payer: Self-pay

## 2014-10-08 VITALS — BP 117/59 | HR 54 | Temp 97.7°F | Resp 20 | Ht 64.0 in | Wt 141.2 lb

## 2014-10-08 DIAGNOSIS — Z170 Estrogen receptor positive status [ER+]: Secondary | ICD-10-CM | POA: Diagnosis not present

## 2014-10-08 DIAGNOSIS — Z01812 Encounter for preprocedural laboratory examination: Secondary | ICD-10-CM | POA: Insufficient documentation

## 2014-10-08 DIAGNOSIS — C50919 Malignant neoplasm of unspecified site of unspecified female breast: Secondary | ICD-10-CM | POA: Insufficient documentation

## 2014-10-08 DIAGNOSIS — Z9013 Acquired absence of bilateral breasts and nipples: Secondary | ICD-10-CM

## 2014-10-08 HISTORY — DX: Personal history of urinary calculi: Z87.442

## 2014-10-08 LAB — CBC
HEMATOCRIT: 38.1 % (ref 36.0–46.0)
Hemoglobin: 12.4 g/dL (ref 12.0–15.0)
MCH: 26.1 pg (ref 26.0–34.0)
MCHC: 32.5 g/dL (ref 30.0–36.0)
MCV: 80.2 fL (ref 78.0–100.0)
PLATELETS: 289 10*3/uL (ref 150–400)
RBC: 4.75 MIL/uL (ref 3.87–5.11)
RDW: 14.1 % (ref 11.5–15.5)
WBC: 4.7 10*3/uL (ref 4.0–10.5)

## 2014-10-08 LAB — BASIC METABOLIC PANEL
Anion gap: 9 (ref 5–15)
BUN: 12 mg/dL (ref 6–23)
CO2: 27 mmol/L (ref 19–32)
Calcium: 9.3 mg/dL (ref 8.4–10.5)
Chloride: 105 mmol/L (ref 96–112)
Creatinine, Ser: 0.76 mg/dL (ref 0.50–1.10)
GFR calc Af Amer: >90 mL/min (ref >90)
Glucose, Bld: 96 mg/dL (ref 70–99)
Potassium: 4.1 mmol/L (ref 3.5–5.1)
SODIUM: 141 mmol/L (ref 135–145)

## 2014-10-08 LAB — HCG, SERUM, QUALITATIVE: Preg, Serum: NEGATIVE

## 2014-10-15 ENCOUNTER — Other Ambulatory Visit: Payer: Self-pay | Admitting: Plastic Surgery

## 2014-10-15 MED ORDER — CEFAZOLIN SODIUM-DEXTROSE 2-3 GM-% IV SOLR
2.0000 g | INTRAVENOUS | Status: AC
  Administered 2014-10-16: 2 g via INTRAVENOUS
  Filled 2014-10-15: qty 50

## 2014-10-15 NOTE — Progress Notes (Signed)
Pt verbalize understanding of arrvial time 10/16/14 at 1100

## 2014-10-16 ENCOUNTER — Ambulatory Visit (HOSPITAL_COMMUNITY): Payer: BLUE CROSS/BLUE SHIELD | Admitting: Certified Registered"

## 2014-10-16 ENCOUNTER — Ambulatory Visit (HOSPITAL_COMMUNITY)
Admission: RE | Admit: 2014-10-16 | Discharge: 2014-10-16 | Disposition: A | Discharge status: Home / Self Care | Payer: BLUE CROSS/BLUE SHIELD | Source: Ambulatory Visit | Attending: Plastic Surgery | Admitting: Plastic Surgery

## 2014-10-16 ENCOUNTER — Encounter (HOSPITAL_COMMUNITY): Payer: Self-pay | Admitting: *Deleted

## 2014-10-16 ENCOUNTER — Encounter (HOSPITAL_COMMUNITY)
Admission: RE | Disposition: A | Discharge status: Home / Self Care | Payer: Self-pay | Source: Ambulatory Visit | Attending: Plastic Surgery

## 2014-10-16 DIAGNOSIS — Z1501 Genetic susceptibility to malignant neoplasm of breast: Secondary | ICD-10-CM | POA: Insufficient documentation

## 2014-10-16 DIAGNOSIS — Z9013 Acquired absence of bilateral breasts and nipples: Secondary | ICD-10-CM | POA: Insufficient documentation

## 2014-10-16 DIAGNOSIS — D649 Anemia, unspecified: Secondary | ICD-10-CM | POA: Insufficient documentation

## 2014-10-16 DIAGNOSIS — Z87442 Personal history of urinary calculi: Secondary | ICD-10-CM | POA: Diagnosis not present

## 2014-10-16 DIAGNOSIS — Z9851 Tubal ligation status: Secondary | ICD-10-CM | POA: Insufficient documentation

## 2014-10-16 HISTORY — PX: REMOVAL OF BILATERAL TISSUE EXPANDERS WITH PLACEMENT OF BILATERAL BREAST IMPLANTS: SHX6431

## 2014-10-16 SURGERY — REMOVAL, TISSUE EXPANDER, BREAST, BILATERAL, WITH BILATERAL IMPLANT IMPLANT INSERTION
Anesthesia: General | Site: Breast | Laterality: Bilateral

## 2014-10-16 MED ORDER — FENTANYL CITRATE (PF) 100 MCG/2ML IJ SOLN
INTRAMUSCULAR | Status: DC | PRN
  Administered 2014-10-16: 50 ug via INTRAVENOUS
  Administered 2014-10-16 (×7): 25 ug via INTRAVENOUS
  Administered 2014-10-16: 50 ug via INTRAVENOUS
  Administered 2014-10-16 (×5): 25 ug via INTRAVENOUS

## 2014-10-16 MED ORDER — ONDANSETRON HCL 4 MG/2ML IJ SOLN
INTRAMUSCULAR | Status: DC | PRN
  Administered 2014-10-16: 4 mg via INTRAVENOUS

## 2014-10-16 MED ORDER — PROPOFOL 10 MG/ML IV BOLUS
INTRAVENOUS | Status: AC
  Filled 2014-10-16: qty 20

## 2014-10-16 MED ORDER — SUFENTANIL CITRATE 50 MCG/ML IV SOLN
INTRAVENOUS | Status: AC
  Filled 2014-10-16: qty 1

## 2014-10-16 MED ORDER — LIDOCAINE HCL (CARDIAC) 20 MG/ML IV SOLN
INTRAVENOUS | Status: AC
  Filled 2014-10-16: qty 5

## 2014-10-16 MED ORDER — EPHEDRINE SULFATE 50 MG/ML IJ SOLN
INTRAMUSCULAR | Status: AC
  Filled 2014-10-16: qty 2

## 2014-10-16 MED ORDER — BUPIVACAINE-EPINEPHRINE (PF) 0.25% -1:200000 IJ SOLN
INTRAMUSCULAR | Status: AC
  Filled 2014-10-16: qty 30

## 2014-10-16 MED ORDER — FENTANYL CITRATE (PF) 250 MCG/5ML IJ SOLN
INTRAMUSCULAR | Status: AC
  Filled 2014-10-16: qty 5

## 2014-10-16 MED ORDER — PROMETHAZINE HCL 25 MG/ML IJ SOLN: 6.2500 mg | INTRAMUSCULAR | Status: DC | PRN

## 2014-10-16 MED ORDER — BUPIVACAINE-EPINEPHRINE 0.25% -1:200000 IJ SOLN
INTRAMUSCULAR | Status: DC | PRN
  Administered 2014-10-16: 4 mL

## 2014-10-16 MED ORDER — OXYCODONE HCL 5 MG PO TABS
5.0000 mg | ORAL_TABLET | Freq: Once | ORAL | Status: AC
  Administered 2014-10-16: 5 mg via ORAL

## 2014-10-16 MED ORDER — DEXAMETHASONE SODIUM PHOSPHATE 4 MG/ML IJ SOLN
INTRAMUSCULAR | Status: DC | PRN
  Administered 2014-10-16: 4 mg via INTRAVENOUS

## 2014-10-16 MED ORDER — MIDAZOLAM HCL 2 MG/2ML IJ SOLN
INTRAMUSCULAR | Status: AC
  Filled 2014-10-16: qty 2

## 2014-10-16 MED ORDER — 0.9 % SODIUM CHLORIDE (POUR BTL) OPTIME
TOPICAL | Status: DC | PRN
  Administered 2014-10-16: 1000 mL

## 2014-10-16 MED ORDER — OXYCODONE HCL 5 MG PO TABS
ORAL_TABLET | ORAL | Status: AC
  Filled 2014-10-16: qty 1

## 2014-10-16 MED ORDER — MIDAZOLAM HCL 2 MG/2ML IJ SOLN: 0.5000 mg | Freq: Once | INTRAMUSCULAR | Status: DC | PRN

## 2014-10-16 MED ORDER — SODIUM CHLORIDE 0.9 % IJ SOLN
INTRAMUSCULAR | Status: AC
  Filled 2014-10-16: qty 10

## 2014-10-16 MED ORDER — DEXAMETHASONE SODIUM PHOSPHATE 4 MG/ML IJ SOLN
INTRAMUSCULAR | Status: AC
Start: 2014-10-16 — End: 2014-10-16
  Filled 2014-10-16: qty 2

## 2014-10-16 MED ORDER — LIDOCAINE HCL (CARDIAC) 20 MG/ML IV SOLN
INTRAVENOUS | Status: DC | PRN
  Administered 2014-10-16: 20 mg via INTRAVENOUS

## 2014-10-16 MED ORDER — HYDROMORPHONE HCL 1 MG/ML IJ SOLN
INTRAMUSCULAR | Status: AC
  Filled 2014-10-16: qty 1

## 2014-10-16 MED ORDER — PROPOFOL 10 MG/ML IV BOLUS
INTRAVENOUS | Status: DC | PRN
  Administered 2014-10-16: 50 mg via INTRAVENOUS
  Administered 2014-10-16: 150 mg via INTRAVENOUS

## 2014-10-16 MED ORDER — SODIUM CHLORIDE 0.9 % IR SOLN
Status: DC | PRN
  Administered 2014-10-16: 500 mL

## 2014-10-16 MED ORDER — LACTATED RINGERS IV SOLN
INTRAVENOUS | Status: DC
  Administered 2014-10-16 (×2): via INTRAVENOUS

## 2014-10-16 MED ORDER — MIDAZOLAM HCL 5 MG/5ML IJ SOLN
INTRAMUSCULAR | Status: DC | PRN
Start: 2014-10-16 — End: 2014-10-16
  Administered 2014-10-16: 2 mg via INTRAVENOUS

## 2014-10-16 MED ORDER — MEPERIDINE HCL 25 MG/ML IJ SOLN: 6.2500 mg | INTRAMUSCULAR | Status: DC | PRN

## 2014-10-16 MED ORDER — HYDROMORPHONE HCL 1 MG/ML IJ SOLN
0.2500 mg | INTRAMUSCULAR | Status: DC | PRN
  Administered 2014-10-16: 0.5 mg via INTRAVENOUS

## 2014-10-16 MED ORDER — EPHEDRINE SULFATE 50 MG/ML IJ SOLN
INTRAMUSCULAR | Status: DC | PRN
  Administered 2014-10-16 (×2): 5 mg via INTRAVENOUS

## 2014-10-16 SURGICAL SUPPLY — 65 items
ADH SKN CLS APL DERMABOND .7 (GAUZE/BANDAGES/DRESSINGS) ×2
BAG DECANTER FOR FLEXI CONT (MISCELLANEOUS) ×3 IMPLANT
BINDER BREAST LRG (GAUZE/BANDAGES/DRESSINGS) IMPLANT
BINDER BREAST MEDIUM (GAUZE/BANDAGES/DRESSINGS) ×2 IMPLANT
BINDER BREAST XLRG (GAUZE/BANDAGES/DRESSINGS) IMPLANT
BIOPATCH RED 1 DISK 7.0 (GAUZE/BANDAGES/DRESSINGS) ×1 IMPLANT
BIOPATCH RED 1IN DISK 7.0MM (GAUZE/BANDAGES/DRESSINGS)
BLADE 10 SAFETY STRL DISP (BLADE) ×1 IMPLANT
BNDG GAUZE ELAST 4 BULKY (GAUZE/BANDAGES/DRESSINGS) ×4 IMPLANT
CANISTER SUCTION 2500CC (MISCELLANEOUS) ×3 IMPLANT
CHLORAPREP W/TINT 26ML (MISCELLANEOUS) ×3 IMPLANT
COVER SURGICAL LIGHT HANDLE (MISCELLANEOUS) ×3 IMPLANT
DERMABOND ADVANCED (GAUZE/BANDAGES/DRESSINGS) ×4
DERMABOND ADVANCED .7 DNX12 (GAUZE/BANDAGES/DRESSINGS) ×1 IMPLANT
DRAIN CHANNEL 19F RND (DRAIN) ×1 IMPLANT
DRAPE ORTHO SPLIT 77X108 STRL (DRAPES) ×6
DRAPE PROXIMA HALF (DRAPES) ×6 IMPLANT
DRAPE SURG 17X23 STRL (DRAPES) ×4 IMPLANT
DRAPE SURG ORHT 6 SPLT 77X108 (DRAPES) ×2 IMPLANT
DRAPE WARM FLUID 44X44 (DRAPE) ×1 IMPLANT
DRSG PAD ABDOMINAL 8X10 ST (GAUZE/BANDAGES/DRESSINGS) ×6 IMPLANT
ELECT BLADE 4.0 EZ CLEAN MEGAD (MISCELLANEOUS) ×3
ELECT REM PT RETURN 9FT ADLT (ELECTROSURGICAL) ×3
ELECTRODE BLDE 4.0 EZ CLN MEGD (MISCELLANEOUS) ×1 IMPLANT
ELECTRODE REM PT RTRN 9FT ADLT (ELECTROSURGICAL) ×1 IMPLANT
EVACUATOR SILICONE 100CC (DRAIN) ×1 IMPLANT
GAUZE SPONGE 4X4 12PLY STRL (GAUZE/BANDAGES/DRESSINGS) ×1 IMPLANT
GLOVE BIO SURGEON STRL SZ 6.5 (GLOVE) ×5 IMPLANT
GLOVE BIO SURGEON STRL SZ7 (GLOVE) ×2 IMPLANT
GLOVE BIO SURGEONS STRL SZ 6.5 (GLOVE) ×4
GLOVE BIOGEL PI IND STRL 7.0 (GLOVE) IMPLANT
GLOVE BIOGEL PI INDICATOR 7.0 (GLOVE) ×2
GLOVE SURG SS PI 7.0 STRL IVOR (GLOVE) ×2 IMPLANT
GOWN STRL REUS W/ TWL LRG LVL3 (GOWN DISPOSABLE) ×2 IMPLANT
GOWN STRL REUS W/TWL LRG LVL3 (GOWN DISPOSABLE) ×9
IMPLANT BREAST GEL 345CC (Breast) ×4 IMPLANT
IMPLANT BREAST MP GEL 390CC (Breast) ×2 IMPLANT
KIT BASIN OR (CUSTOM PROCEDURE TRAY) ×3 IMPLANT
KIT ROOM TURNOVER OR (KITS) ×3 IMPLANT
MENTOR MEMORY SHAPE MH RESTERILIZABLE GEL SIZER, 345CC ×1 IMPLANT
MENTOR MEMORY SHAPE MH RESTERILIZABLE GEL SIZER, 390CC ×2 IMPLANT
NDL 18GX1X1/2 (RX/OR ONLY) (NEEDLE) IMPLANT
NDL HYPO 25GX1X1/2 BEV (NEEDLE) IMPLANT
NEEDLE 18GX1X1/2 (RX/OR ONLY) (NEEDLE) ×3 IMPLANT
NEEDLE HYPO 25GX1X1/2 BEV (NEEDLE) ×3 IMPLANT
NS IRRIG 1000ML POUR BTL (IV SOLUTION) ×4 IMPLANT
PACK GENERAL/GYN (CUSTOM PROCEDURE TRAY) ×3 IMPLANT
PAD ARMBOARD 7.5X6 YLW CONV (MISCELLANEOUS) ×3 IMPLANT
PIN SAFETY STERILE (MISCELLANEOUS) ×1 IMPLANT
SET ASEPTIC TRANSFER (MISCELLANEOUS) IMPLANT
SIZER BREAST REUSE 345CC (SIZER) ×2 IMPLANT
SIZER BREAST REUSE 390CC (SIZER) ×2 IMPLANT
STAPLER VISISTAT 35W (STAPLE) IMPLANT
SUT MNCRL AB 3-0 PS2 18 (SUTURE) ×6 IMPLANT
SUT MNCRL AB 4-0 PS2 18 (SUTURE) ×6 IMPLANT
SUT MON AB 5-0 PS2 18 (SUTURE) ×8 IMPLANT
SUT PDS AB 2-0 CT1 27 (SUTURE) ×6 IMPLANT
SUT SILK 3 0 SH 30 (SUTURE) ×1 IMPLANT
SUT VIC AB 3-0 SH 27 (SUTURE)
SUT VIC AB 3-0 SH 27X BRD (SUTURE) ×3 IMPLANT
SUT VICRYL 4-0 PS2 18IN ABS (SUTURE) ×2 IMPLANT
SYR CONTROL 10ML LL (SYRINGE) ×2 IMPLANT
TOWEL OR 17X24 6PK STRL BLUE (TOWEL DISPOSABLE) ×1 IMPLANT
TOWEL OR 17X26 10 PK STRL BLUE (TOWEL DISPOSABLE) ×3 IMPLANT
TRAY FOLEY CATH 14FRSI W/METER (CATHETERS) IMPLANT

## 2014-10-16 NOTE — Progress Notes (Signed)
Dilaudid 0.5 mg IV wasted in sharps container.  Witnessed by D. Ralene Bathe, RN ( patient no longer in system)

## 2014-10-16 NOTE — Brief Op Note (Signed)
10/16/2014  2:59 PM  PATIENT:  Debra Lowery  39 y.o. female  PRE-OPERATIVE DIAGNOSIS:  BREAST CANCER BRCA GENE POSITIVE   POST-OPERATIVE DIAGNOSIS:  BREAST CANCER BRCA GENE POSITIVE   PROCEDURE:  Procedure(s): REMOVAL OF BILATERAL TISSUE EXPANDERS WITH PLACEMENT OF BILATERAL BREAST IMPLANTS (Bilateral)  SURGEON:  Surgeon(s) and Role:    * Ina Scrivens Sanger, DO - Primary  PHYSICIAN ASSISTANT: Shawn Rayburn, PA  ASSISTANTS: none   ANESTHESIA:   general  EBL:  Total I/O In: 1200 [I.V.:1200] Out: -   BLOOD ADMINISTERED:none  DRAINS: none   LOCAL MEDICATIONS USED:  marcaine  SPECIMEN:  No Specimen  DISPOSITION OF SPECIMEN:  N/A  COUNTS:  YES  TOURNIQUET:  * No tourniquets in log *  DICTATION: .Dragon Dictation  PLAN OF CARE: Discharge to home after PACU  PATIENT DISPOSITION:  PACU - hemodynamically stable.   Delay start of Pharmacological VTE agent (>24hrs) due to surgical blood loss or risk of bleeding: no

## 2014-10-16 NOTE — H&P (Signed)
Debra Lowery is an 39 y.o. female.   Chief Complaint: Breast disease HPI: The patient is a 39 yrs old female here for post operative follow up after immediate breast reconstruction. She has a strong family history of breast cancer and known familial BRCA1 mutation. Her mother was positive for the BRCA1 mutation which initiated her to be tested as well. She is not a smoker and is in good health. The family history of cancer includes mgm with breast cancer, one with esophageal cancer, mother with breast cancer, paternal aunt with breast cancer and mgf with prostate cancer. Menarche was at age 84. She is G4P4A0, first live birth at age 51. She took oral contraceptive for 5 years. She has not used HRT in the past. There is no reported Ashkenazi Jewish ancestry. She had grade III ptosis of bilateral breast likely related to the breast feeding. She had loss of upper pole fullness. She underwent nipple sparing mastectomies with expander and FlexHD placement. She is 5 feet 4 inches tall, weighs 150 pounds and wears a 34C bra. She would be happy around the same size or smaller. She has a total of 340 cc in 275 cc expanders.  Past Medical History  Diagnosis Date  . BRCA gene positive     BRCA 1  . Mass of breast, left   . Pre-eclampsia in third trimester   . Bronchitis   . Wears contact lenses   . Anemia   . BRCA1 positive 04/17/2013  . History of kidney stones     Past Surgical History  Procedure Laterality Date  . Knee arthroscopy      left  . Tubal ligation    . Tubal reversal    . Colonoscopy    . Wisdom teeth extraction    . Breast reconstruction with placement of tissue expander and flex hd (acellular hydrated dermis) Bilateral 07/18/2014    Procedure: IMMEDIATE BILATERAL BREAST RECONSTRUCTION WITH PLACEMENT OF TISSUE EXPANDER AND FLEX HD (ACELLULAR HYDRATED DERMIS);  Surgeon: Theodoro Kos, DO;  Location: [place];  Service: Plastics;  Laterality: Bilateral;    Family History  Problem  Relation Age of Onset  . Breast cancer Mother 65  . Breast cancer Paternal Aunt 66  . Breast cancer Maternal Grandmother 66  . Esophageal cancer Maternal Grandmother   . Prostate cancer Maternal Grandfather 60   Social History:  reports that she has never smoked. She has never used smokeless tobacco. She reports that she does not drink alcohol or use illicit drugs.  Allergies: No Known Allergies  Medications Prior to Admission  Medication Sig Dispense Refill  . bisacodyl (DULCOLAX) 5 MG EC tablet Take 1 tablet (5 mg total) by mouth daily as needed for moderate constipation. (Patient not taking: Reported on 10/02/2014) 30 tablet 0  . diazepam (VALIUM) 2 MG tablet Take 1 tablet (2 mg total) by mouth every 12 (twelve) hours as needed for muscle spasms. (Patient not taking: Reported on 10/02/2014) 30 tablet 0  . diphenhydrAMINE (BENADRYL) 25 mg capsule Take 1 capsule (25 mg total) by mouth every 6 (six) hours as needed for itching. (Patient not taking: Reported on 10/02/2014) 30 capsule 0  . ondansetron (ZOFRAN) 4 MG tablet Take 1 tablet (4 mg total) by mouth every 8 (eight) hours as needed for nausea or vomiting. (Patient not taking: Reported on 10/02/2014) 20 tablet 0  . oxyCODONE (OXY IR/ROXICODONE) 5 MG immediate release tablet Take 1-3 tablets (5-15 mg total) by mouth every 4 (four) hours as  needed for moderate pain or severe pain (Use 5 mg prn for milder pain, 10 mg prn for moderate pain and 37m prn for more severe pain). (Patient not taking: Reported on 10/02/2014) 60 tablet 0  . OxyCODONE (OXYCONTIN) 10 mg T12A 12 hr tablet Take 1 tablet (10 mg total) by mouth every 12 (twelve) hours. (Patient not taking: Reported on 10/02/2014) 20 tablet 0  . polyethylene glycol (MIRALAX / GLYCOLAX) packet Take 17 g by mouth daily as needed for mild constipation. (Patient not taking: Reported on 10/02/2014) 14 each 0    No results found for this or any previous visit (from the past 48 hour(s)). No results  found.  Review of Systems  Constitutional: Negative.   HENT: Negative.   Eyes: Negative.   Respiratory: Negative.   Cardiovascular: Negative.   Gastrointestinal: Negative.   Genitourinary: Negative.   Musculoskeletal: Negative.   Skin: Negative.   Neurological: Negative.   Psychiatric/Behavioral: Negative.     Blood pressure 117/60, pulse 56, temperature 98.4 F (36.9 C), temperature source Oral, resp. rate 18, height 5' 4"  (1.626 m), weight 64.042 kg (141 lb 3 oz), SpO2 100 %. Physical Exam  Constitutional: She is oriented to person, place, and time. She appears well-developed and well-nourished.  HENT:  Head: Normocephalic and atraumatic.  Eyes: Pupils are equal, round, and reactive to light.  Neck: Normal range of motion.  Cardiovascular: Normal rate.   Respiratory: Effort normal.  Musculoskeletal: Normal range of motion.  Neurological: She is alert and oriented to person, place, and time.  Skin: Skin is warm.  Psychiatric: She has a normal mood and affect. Her behavior is normal. Judgment and thought content normal.     Assessment/Plan Plan for removal of expanders and placement of implants (silicone anatomic shape).  Risks and complications were reviewed and include bleeding, pain, scar and need for further surgery.  SANGER,Ashanti Littles 10/16/2014, 10:42 AM

## 2014-10-16 NOTE — Transfer of Care (Signed)
Immediate Anesthesia Transfer of Care Note  Patient: Debra Lowery  Procedure(s) Performed: Procedure(s): REMOVAL OF BILATERAL TISSUE EXPANDERS WITH PLACEMENT OF BILATERAL BREAST IMPLANTS (Bilateral)  Patient Location: PACU  Anesthesia Type:General  Level of Consciousness: awake, alert  and oriented  Airway & Oxygen Therapy: Patient Spontanous Breathing and Patient connected to nasal cannula oxygen  Post-op Assessment: Report given to RN, Post -op Vital signs reviewed and stable and Patient moving all extremities X 4  Post vital signs: Reviewed and stable  Last Vitals:  Filed Vitals:   10/16/14 1500  BP: 117/67  Pulse: 80  Temp: 36.6 C  Resp: 13    Complications: No apparent anesthesia complications

## 2014-10-16 NOTE — Anesthesia Postprocedure Evaluation (Signed)
  Anesthesia Post-op Note  Patient: Debra Lowery  Procedure(s) Performed: Procedure(s): REMOVAL OF BILATERAL TISSUE EXPANDERS WITH PLACEMENT OF BILATERAL BREAST IMPLANTS (Bilateral)  Patient Location: PACU  Anesthesia Type:General  Level of Consciousness: awake, alert , oriented and patient cooperative  Airway and Oxygen Therapy: Patient Spontanous Breathing  Post-op Pain: mild  Post-op Assessment: Post-op Vital signs reviewed, Patient's Cardiovascular Status Stable, Respiratory Function Stable, Patent Airway, No signs of Nausea or vomiting and Pain level controlled  Post-op Vital Signs: Reviewed and stable  Last Vitals:  Filed Vitals:   10/16/14 1605  BP: 109/62  Pulse: 61  Temp:   Resp: 18    Complications: No apparent anesthesia complications

## 2014-10-16 NOTE — Anesthesia Procedure Notes (Signed)
Procedure Name: LMA Insertion Date/Time: 10/16/2014 12:43 PM Performed by: Merrilyn Puma B Pre-anesthesia Checklist: Patient identified, Timeout performed, Emergency Drugs available, Suction available and Patient being monitored Patient Re-evaluated:Patient Re-evaluated prior to inductionOxygen Delivery Method: Circle system utilized Preoxygenation: Pre-oxygenation with 100% oxygen Intubation Type: IV induction LMA: LMA inserted LMA Size: 4.0 Number of attempts: 1 Placement Confirmation: positive ETCO2 and breath sounds checked- equal and bilateral Tube secured with: Tape Dental Injury: Teeth and Oropharynx as per pre-operative assessment

## 2014-10-16 NOTE — Discharge Instructions (Signed)
May shower starting Friday Continue binder or sports bra

## 2014-10-16 NOTE — Op Note (Signed)
Op report Bilateral Exchange   DATE OF OPERATION: 10/16/2014  LOCATION: [facility] outpatient  SURGICAL DIVISION: Plastic Surgery  PREOPERATIVE DIAGNOSES:  1. BRCA positive.  2. Acquired absence of bilateral breast.   POSTOPERATIVE DIAGNOSES:  1. BRCA positive.  2. Acquired absence of bilateral breast.   PROCEDURE:  1. Bilateral exchange of tissue expanders for implants.  2. Bilateral capsulotomies for implant respositioning.  SURGEON: Theodoro Kos, DO  ASSISTANT: Shawn Rayburn, PA  ANESTHESIA:  General.   COMPLICATIONS: None.   IMPLANTS: Left - Mentor memory shape Breast Implant 345cc. Ref #[id number].  Serial Number [id number] Right - Mentor memory shape breast implant 345cc. Ref #[id number].  Serial Number  [id number]  INDICATIONS FOR PROCEDURE:  The patient, Debra Lowery, is a 39 y.o. female born on [birth year], is here for treatment after bilateral mastectomies.  She had tissue expanders placed at the time of mastectomies. She now presents for exchange of her expanders for implants.  She requires capsulotomies to better position the implants. MRN: [id number]  CONSENT:  Informed consent was obtained directly from the patient. Risks, benefits and alternatives were fully discussed. Specific risks including but not limited to bleeding, infection, hematoma, seroma, scarring, pain, implant infection, implant extrusion, capsular contracture, asymmetry, wound healing problems, and need for further surgery were all discussed. The patient did have an ample opportunity to have her questions answered to her satisfaction.   DESCRIPTION OF PROCEDURE:  The patient was taken to the operating room. SCDs were placed and IV antibiotics were given. The patient's chest was prepped and draped in a sterile fashion. A time out was performed and the implants to be used were identified.    On the right breast: One percent Lidocaine with epinephrine was used to infiltrate at the incision  site. The old mastectomy scar was excised.  The mastectomy flaps from the superior and inferior flaps were raised over the pectoralis major muscle for several centimeters to minimize tension for the closure. The pectoralis was split inferior to the skin incision to expose and remove the tissue expander.  Inspection of the pocket showed a normal healthy capsule and good integration of the biologic matrix.  The pocket was irrigated with antibiotic solution.  Circumferential capsulotomies were performed to allow for breast pocket expansion.  Measurements were made and a sizer used to confirm adequate pocket size for the implant dimensions.  Hemostasis was ensured with electrocautery. New gloves were placed. The implant was soaked in antibiotic solution and then placed in the pocket and oriented appropriately. The pectoralis major muscle and capsule on the anterior surface were re-closed with a 3-0 Monocryl suture. The remaining skin was closed with 4-0 Monocryl deep dermal and 5-0 Monocryl subcuticular stitches.   On the left breast: The old mastectomy scar was excised.  The mastectomy flaps from the superior and inferior flaps were raised over the pectoralis major muscle for several centimeters to minimize tension for the closure. The pectoralis was split inferior to the skin incision to expose and remove the tissue expander.  Inspection of the pocket showed a normal healthy capsule and good integration of the biologic matrix.   Circumferential capsulotomies were performed to allow for breast pocket expansion.  Measurements were made and a sizer utilized to confirm adequate pocket size for the implant dimensions.  Hemostasis was ensured with the electrocautery.  New gloves were applied. The implant was soaked in antibiotic solution and placed in the pocket and oriented appropriately. The pectoralis major muscle and capsule on  the anterior surface were re-closed with a 3-0 Monocryl suture. The remaining skin was  closed with 4-0 Monocryl deep dermal and 5-0 Monocryl subcuticular stitches.  Dermabond was applied to the incision site. A breast binder and ABDs were placed.  The patient was allowed to wake from anesthesia and taken to the recovery room in satisfactory condition.

## 2014-10-16 NOTE — Anesthesia Preprocedure Evaluation (Addendum)
Anesthesia Evaluation  Patient identified by MRN, date of birth, ID band Patient awake    Reviewed: Allergy & Precautions, NPO status , Patient's Chart, lab work & pertinent test results  History of Anesthesia Complications Negative for: history of anesthetic complications  Airway Mallampati: I  TM Distance: >3 FB Neck ROM: Full    Dental  (+) Teeth Intact, Dental Advisory Given   Pulmonary neg pulmonary ROS,  breath sounds clear to auscultation        Cardiovascular negative cardio ROS  Rhythm:Regular Rate:Normal     Neuro/Psych negative neurological ROS     GI/Hepatic negative GI ROS, Neg liver ROS,   Endo/Other  negative endocrine ROS  Renal/GU negative Renal ROS     Musculoskeletal   Abdominal   Peds  Hematology   Anesthesia Other Findings   Reproductive/Obstetrics S/p BTL                            Anesthesia Physical Anesthesia Plan  ASA: II  Anesthesia Plan: General   Post-op Pain Management:    Induction: Intravenous  Airway Management Planned: LMA  Additional Equipment:   Intra-op Plan:   Post-operative Plan:   Informed Consent: I have reviewed the patients History and Physical, chart, labs and discussed the procedure including the risks, benefits and alternatives for the proposed anesthesia with the patient or authorized representative who has indicated his/her understanding and acceptance.   Dental advisory given  Plan Discussed with: CRNA and Surgeon  Anesthesia Plan Comments: (Plan routine monitors, GA- LMA OK)        Anesthesia Quick Evaluation

## 2014-10-23 ENCOUNTER — Encounter (HOSPITAL_COMMUNITY): Payer: Self-pay | Admitting: Plastic Surgery

## 2015-01-29 ENCOUNTER — Other Ambulatory Visit: Payer: Self-pay | Admitting: Obstetrics and Gynecology

## 2015-01-30 LAB — CYTOLOGY - PAP

## 2015-03-05 ENCOUNTER — Ambulatory Visit (HOSPITAL_COMMUNITY)
Admission: RE | Admit: 2015-03-05 | Discharge: 2015-03-05 | Disposition: A | Discharge status: Home / Self Care | Payer: BLUE CROSS/BLUE SHIELD | Source: Ambulatory Visit | Attending: Obstetrics and Gynecology | Admitting: Obstetrics and Gynecology

## 2015-03-05 ENCOUNTER — Encounter (HOSPITAL_COMMUNITY): Payer: Self-pay

## 2015-03-05 ENCOUNTER — Other Ambulatory Visit (HOSPITAL_COMMUNITY): Payer: Self-pay | Admitting: Obstetrics and Gynecology

## 2015-03-05 VITALS — BP 129/71 | HR 66 | Wt 163.0 lb

## 2015-03-05 DIAGNOSIS — O288 Other abnormal findings on antenatal screening of mother: Secondary | ICD-10-CM

## 2015-03-05 DIAGNOSIS — O351XX0 Maternal care for (suspected) chromosomal abnormality in fetus, not applicable or unspecified: Secondary | ICD-10-CM | POA: Insufficient documentation

## 2015-03-05 DIAGNOSIS — O3510X0 Maternal care for (suspected) chromosomal abnormality in fetus, unspecified, not applicable or unspecified: Secondary | ICD-10-CM

## 2015-03-05 DIAGNOSIS — Z315 Encounter for genetic counseling: Secondary | ICD-10-CM | POA: Diagnosis present

## 2015-03-05 DIAGNOSIS — O289 Unspecified abnormal findings on antenatal screening of mother: Secondary | ICD-10-CM

## 2015-03-05 DIAGNOSIS — O285 Abnormal chromosomal and genetic finding on antenatal screening of mother: Secondary | ICD-10-CM

## 2015-03-05 DIAGNOSIS — Z3A15 15 weeks gestation of pregnancy: Secondary | ICD-10-CM

## 2015-03-05 DIAGNOSIS — Z8489 Family history of other specified conditions: Secondary | ICD-10-CM

## 2015-03-05 DIAGNOSIS — O09522 Supervision of elderly multigravida, second trimester: Secondary | ICD-10-CM | POA: Insufficient documentation

## 2015-03-05 LAB — AP-AFP (ALPHA FETOPROTEIN)

## 2015-03-05 LAB — ROUTINE CHROMOSOME - KARYOTYPE + FISH

## 2015-03-05 NOTE — Progress Notes (Signed)
Appointment Date: 03/05/2015 DOB: 03/10/1976 Referring Provider: [name], MD Attending: Viann Fish, MD  Debra Lowery and her husband, Debra Lowery, were seen for genetic counseling because of a maternal age of 39 y.o. and a non invasive prenatal screening (NIPS) result with a high risk for Down syndrome.   They were counseled regarding maternal age and the association with risk for chromosome conditions due to nondisjunction.   We reviewed chromosomes, nondisjunction, and the associated 1 in 23 risk for fetal aneuploidy related to a maternal age of 39 y.o. at 34w1dgestation.  They were counseled that the risk for aneuploidy decreases as gestational age increases, accounting for those pregnancies which spontaneously abort.  We specifically discussed Down syndrome (trisomy 254, trisomies 153and 146 and sex chromosome aneuploidies (47,XXX and 47,XXY) including the common features and prognoses of each.   Mrs. RUpsonwas offered NIPS (Panorama) because of her age.  The results showed a greater than 99% chance of fetal Down syndrome. We discussed that this testing identifies >99% of pregnancies with Down syndrome with a false positive rate of 0.1%. We reviewed that this testing is highly sensitive and specific, but is not considered diagnostic. We reviewed that the positive predictive value (PPV) is the percentage of those patients who have a positive NIPS result who actually have fetal Down syndrome. New literature (Mina Marble2014) suggests that the PPV for Down syndrome (based on cytogenetic follow up) is 93% for women who are ~40years of age. We spent significant time reviewing this technology and the accuracy of NIPS and other screening versus diagnostic tests.   We reviewed that the cell free fetal DNA test can not distinguish between aneuploidy confined to the placenta, trisomy 21, translocation 21, or mosaic trisomy 21. We discussed the availability of amniocentesis or peripheral  blood chromosome analysis for confirmation of the diagnosis.  Mrs. RDanishdiscussed her recent history of bilateral mastectomy with reconstruction, she stated that after her surgery she realized that she had tissue donated to her to complete the reconstruction. We discussed that it is unclear how the cells from this donated tissue may break down and impact these results.  However, literature has shown that the presence of foreign cells has been identified as a source of false positive results.  We discussed that regardless, there is at least a 90% chance that this Panorama result is consistent with a true positive finding.  They were also counseled regarding diagnostic testing via amniocentesis. We reviewed the approximate 1 in 3810-175risk for complications for amniocentesis, including spontaneous pregnancy loss. After consideration of all the options, they elected to proceed with amniocentesis today.  Preliminary FISH results will be available by the end of the day tomorrow, and final results will be available in approximately 8-10 days.     Considering the very high suspicion of Down syndrome, they were counseled in detail regarding this diagnosis.  We discussed that Down syndrome occurs once per every 800 births and is associated with specific features. However, there is a high degree of variability seen among children who have this condition, meaning that every child with Down syndrome will not be affected in exactly the same way, and some children will have more or less features than others. They were counseled that half of individuals with Down syndrome have a cardiac anomaly and ~10-15% have an intestinal difference. Other anomalies of various organ systems have also been described in association with this diagnosis. Targeted ultrasound at this office as well as  a fetal echocardiogram are recommended for a detailed evaluation if the diagnosis of Down syndrome is confirmed.   We discussed that in  general most individuals with Down syndrome have mild to moderate mental retardation and likely will require extra assistance with school work. Approximately 70-80% of children with Down syndrome have hypotonia which may lead to delays in sitting, walking, and talking. Hypotonia does tend to improve with age and early intervention services such as physical, occupational, and speech therapies can help with achievement of developmental milestones. We discussed that these therapies are typically provided to children (with qualifying diagnoses) from birth until age 57. We also discussed that Down syndrome is associated with characteristic facial features. Because of these facial features, many children with Down syndrome look similar to each other, but they were reminded that each child with Down syndrome is unique and will have many more features in common with his or her own family members.   We also reviewed that the AAP have established health supervision guidelines for individuals with Down syndrome. These guidelines provide medical management recommendations for various stages of life and can be a resource for families who have a child with Down syndrome as well as for health professionals. We also discussed that providing children with Down syndrome with a stimulating physical and social environment, as well as ensuring that the child receives adequate medical care and appropriate therapies, will help these children to reach their full potential. With the advances in medical technology, early intervention, and supportive therapies, many individuals with Down syndrome are able to live with an increasing degree of independence. Today, many adults with Down syndrome care for themselves, have jobs, and often times live in group homes or apartments where assistance is available if needed.  We discussed that if the diagnosis of Down syndrome is confirmed by amniocentesis, that we can connect them with local and  national support organizations.  Both family histories were reviewed.  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  We discussed that mutations in this gene have been associated with an increased risk for early onset breast and ovarian cancer.  The predisposition to develop cancer occurs when a person has single gene mutation, thus this predisposition is passed on as an autosomal dominant trait.  Mrs. Shorb had met with a Dietitian to discuss this gene and the results and was aware of the 1 in 2 or 50% chance to pass it on with each pregnancy.  The remainder of the family history was reviewed and found to be noncontributory for birth defects, intellectual disability, and known genetic conditions. Without further information regarding the provided family history, an accurate genetic risk cannot be calculated. Further genetic counseling is warranted if more information is obtained.  Mrs. Hoskinson denied exposure to environmental toxins or chemical agents. She denied the use of alcohol, tobacco or street drugs. She denied significant viral illnesses during the course of her pregnancy. Her medical and surgical histories were noncontributory.   I counseled this couple regarding the above risks and available options.  The approximate face-to-face time with the genetic counselor was 60 minutes.  Cam Hai, MS,  Certified Genetic Counselor

## 2015-03-06 ENCOUNTER — Other Ambulatory Visit (HOSPITAL_COMMUNITY): Payer: Self-pay | Admitting: Obstetrics and Gynecology

## 2015-03-11 ENCOUNTER — Other Ambulatory Visit (HOSPITAL_COMMUNITY): Payer: Self-pay | Admitting: Obstetrics and Gynecology

## 2015-03-11 ENCOUNTER — Encounter (HOSPITAL_COMMUNITY): Payer: Self-pay | Admitting: Obstetrics and Gynecology

## 2015-03-12 ENCOUNTER — Ambulatory Visit (HOSPITAL_COMMUNITY)
Admission: RE | Admit: 2015-03-12 | Discharge: 2015-03-12 | Disposition: A | Discharge status: Home / Self Care | Payer: BLUE CROSS/BLUE SHIELD | Source: Ambulatory Visit | Attending: Obstetrics and Gynecology | Admitting: Obstetrics and Gynecology

## 2015-03-12 DIAGNOSIS — O09522 Supervision of elderly multigravida, second trimester: Secondary | ICD-10-CM

## 2015-03-12 DIAGNOSIS — O09529 Supervision of elderly multigravida, unspecified trimester: Secondary | ICD-10-CM | POA: Insufficient documentation

## 2015-03-12 DIAGNOSIS — Z3A16 16 weeks gestation of pregnancy: Secondary | ICD-10-CM | POA: Insufficient documentation

## 2015-03-12 DIAGNOSIS — O289 Unspecified abnormal findings on antenatal screening of mother: Secondary | ICD-10-CM | POA: Insufficient documentation

## 2015-03-12 NOTE — Progress Notes (Signed)
Appointment Date: 03/12/2015  DOB: Aug 13, 1975 Referring Placida Cambre: [name], MD Attending: Dr. [place]  Mrs. NIKIESHA MILFORD was seen for genetic counseling to follow up on the results of her amniocentesis.   She stated that she is having a hard time with the diagnosis but feels that she cannot end the pregnancy. Her husband feels that they should not continue.  We met to discuss these issues.  Mrs. Lightle was very tearful and shared that her husband was worried about the medical issues, and the time and money involved in raising a child with Down syndrome.  We again discussed that approximately half of babies with Down syndrome are born with a heart defect that may require surgery after birth. Children with Down syndrome also have an increased chance for thyroid problems, which can range from an underactive to an overactive thyroid. Low muscle tone, vision problems, and respiratory and ear infections are more common among babies with Down syndrome. We discussed that, prenatally, we cannot predict all features that would be present in an individual with Down syndrome. We discussed that we have scheduled an anatomy ultrasound as well as a fetal echocardiogram to further assess the fetal anatomy and provide information about the specific health concerns in their baby.  We discussed that there are different ways to proceed with the pregnancy. If they continue, we would follow the pregnancy with ultrasounds.  Mrs. Lindh wanted to understand more about the option of termination.  We discussed that it is option to end the pregnancy until [redacted] weeks gestation in the state of [place], after a 72 hour waiting period.  It is an option up to 24 weeks in many other states.  She asked about how the procedure would be performed and we discussed induction as well as the surgical procedure.  We discussed that it must have been very difficult to hear about the diagnosis of Down syndrome for  their baby. It is very normal for anyone in their situation to feel upset or overwhelmed with a number of emotions.  Mrs. Leonhart expressed concern that she and her husband seem to be in different places, and that she worries about having time for this baby as well as for her other children.  She worries about feeling she is alone in her desire to continue and wants she and her husband to be unified in their wishes and plans.  We discussed that these are all very normal feelings and reactions.  It is important to keep communicating and to ensure that they are listening and hearing what the other one is saying about their fears.  Mrs. Deady has met with parents of a child with Down syndrome, but her husband is not yet ready to do that.  Mrs. Wolz requested some information about Down syndrome and I provided her with booklet "Understanding a Prenatal Diagnosis of Down syndrome".     Mrs. Merrick asked many good questions and I did my best to answer them.  She remains undecided, but wishes to proceed with the anatomy ultrasound and fetal echo so that she can have more information upon which to base their decision.  She has my contact information should she wish to talk more at any time.  The approximate face-to-face time with the genetic counselor was 125 minutes.  Cam Hai, MS,  Certified Genetic Counselor

## 2015-03-14 ENCOUNTER — Other Ambulatory Visit: Payer: Self-pay | Admitting: Obstetrics and Gynecology

## 2015-03-24 ENCOUNTER — Other Ambulatory Visit (HOSPITAL_COMMUNITY): Payer: Self-pay | Admitting: Obstetrics and Gynecology

## 2015-03-24 ENCOUNTER — Ambulatory Visit (HOSPITAL_COMMUNITY)
Admission: RE | Admit: 2015-03-24 | Discharge: 2015-03-24 | Disposition: A | Discharge status: Home / Self Care | Payer: BLUE CROSS/BLUE SHIELD | Source: Ambulatory Visit | Attending: Obstetrics and Gynecology | Admitting: Obstetrics and Gynecology

## 2015-03-24 DIAGNOSIS — Z3A17 17 weeks gestation of pregnancy: Secondary | ICD-10-CM | POA: Diagnosis not present

## 2015-03-24 DIAGNOSIS — Z3689 Encounter for other specified antenatal screening: Secondary | ICD-10-CM

## 2015-03-24 DIAGNOSIS — O289 Unspecified abnormal findings on antenatal screening of mother: Secondary | ICD-10-CM

## 2015-03-24 DIAGNOSIS — O283 Abnormal ultrasonic finding on antenatal screening of mother: Secondary | ICD-10-CM | POA: Insufficient documentation

## 2015-03-24 DIAGNOSIS — O09522 Supervision of elderly multigravida, second trimester: Secondary | ICD-10-CM

## 2015-03-24 DIAGNOSIS — Z36 Encounter for antenatal screening of mother: Secondary | ICD-10-CM | POA: Diagnosis not present

## 2015-03-25 ENCOUNTER — Ambulatory Visit (HOSPITAL_COMMUNITY): Payer: BLUE CROSS/BLUE SHIELD

## 2015-03-26 ENCOUNTER — Ambulatory Visit (HOSPITAL_COMMUNITY)
Admission: RE | Admit: 2015-03-26 | Discharge: 2015-03-26 | Disposition: A | Discharge status: Home / Self Care | Payer: BLUE CROSS/BLUE SHIELD | Source: Ambulatory Visit | Attending: Obstetrics and Gynecology | Admitting: Obstetrics and Gynecology

## 2015-03-26 DIAGNOSIS — O351XX0 Maternal care for (suspected) chromosomal abnormality in fetus, not applicable or unspecified: Secondary | ICD-10-CM

## 2015-03-26 DIAGNOSIS — Z3A18 18 weeks gestation of pregnancy: Secondary | ICD-10-CM

## 2015-03-26 DIAGNOSIS — O3513X0 Maternal care for (suspected) chromosomal abnormality in fetus, trisomy 21, not applicable or unspecified: Secondary | ICD-10-CM

## 2015-03-27 ENCOUNTER — Encounter (HOSPITAL_COMMUNITY): Payer: Self-pay | Admitting: Obstetrics and Gynecology

## 2015-03-27 NOTE — Progress Notes (Signed)
Appointment Date: 03/25/2012 DOB: 1976-01-30 Referring Provider: [name], MD Attending: Dr. Seward Meth  Debra Lowery and her husband, Debra Lowery, were seen for follow up genetic counseling to discuss the amniocentesis results.  [name] was very tearful and she and her husband shared a lot of their concerns about having another child, and in particular having a child with special needs.  We discussed both sides of the issue: the option of continuing and the option of termination of pregnancy. [name] has met with people who have children with Down syndrome, has spoken on the phone with a family who elected to terminate a pregnancy when they learned the baby had Down syndrome, and they have watched documentaries about adults with Down syndrome.  They have been very deliberate and thorough in their consideration of both options.  [name] signed the [facility] termination of pregnancy paperwork on Monday with Dr. Burnett Harry, just so that it was done and they would have flexibility in scheduling as they would have completed the 72 hour waiting period.    I provided non-directive client centered psychosocial counseling to facilitate decision making in keeping with their values and objectives.  [name] stated that she was choosing to proceed with termination of pregnancy.  This was discussed with Dr. Jolyn Lent and the following appointments were arranged: Tuesday, October 18th at 77 [name] will have an office visit at [place] with Dr. Sabra Heck for laminaria placement.  She will have a D&E at [facility] on Wednesday October 19th. She will receive a call on Tuesday with the exact time to report to [facility].  She was given the diagnosis and procedure codes to discuss costs with her insurance company.    The approximate face-to-face time with the genetic counselor was 120 minutes.  Cam Hai, MS,  Certified Genetic Counselor

## 2015-03-28 ENCOUNTER — Ambulatory Visit (HOSPITAL_COMMUNITY): Payer: BLUE CROSS/BLUE SHIELD

## 2015-03-28 ENCOUNTER — Encounter (HOSPITAL_COMMUNITY): Payer: BLUE CROSS/BLUE SHIELD

## 2015-05-05 ENCOUNTER — Emergency Department (HOSPITAL_COMMUNITY)
Admission: EM | Admit: 2015-05-05 | Discharge: 2015-05-05 | Discharge status: Against Medical Advice | Payer: BLUE CROSS/BLUE SHIELD | Attending: Emergency Medicine | Admitting: Emergency Medicine

## 2015-05-05 ENCOUNTER — Encounter (HOSPITAL_COMMUNITY): Payer: Self-pay | Admitting: *Deleted

## 2015-05-05 DIAGNOSIS — N938 Other specified abnormal uterine and vaginal bleeding: Secondary | ICD-10-CM | POA: Diagnosis not present

## 2015-05-05 LAB — I-STAT BETA HCG BLOOD, ED (MC, WL, AP ONLY): I-stat hCG, quantitative: 11.2 m[IU]/mL — ABNORMAL HIGH (ref <5)

## 2015-05-05 LAB — CBC
HEMATOCRIT: 37.6 % (ref 36.0–46.0)
Hemoglobin: 12.6 g/dL (ref 12.0–15.0)
MCH: 29.8 pg (ref 26.0–34.0)
MCHC: 33.5 g/dL (ref 30.0–36.0)
MCV: 88.9 fL (ref 78.0–100.0)
Platelets: 252 10*3/uL (ref 150–400)
RBC: 4.23 MIL/uL (ref 3.87–5.11)
RDW: 12.9 % (ref 11.5–15.5)
WBC: 7 10*3/uL (ref 4.0–10.5)

## 2015-05-05 NOTE — ED Notes (Signed)
Pt reports having D&C done last month following a miscarriage. Pt started having vaginal bleeding yesterday and it has been extremely heavy bleeding, denies passing clot but has bleed through approx 7 pads since this am.

## 2015-05-05 NOTE — ED Notes (Signed)
Pt sts she is leaving 

## 2015-05-15 ENCOUNTER — Other Ambulatory Visit (HOSPITAL_COMMUNITY): Payer: Self-pay

## 2015-09-17 ENCOUNTER — Encounter (HOSPITAL_BASED_OUTPATIENT_CLINIC_OR_DEPARTMENT_OTHER): Payer: Self-pay | Admitting: *Deleted

## 2015-09-30 ENCOUNTER — Encounter (HOSPITAL_BASED_OUTPATIENT_CLINIC_OR_DEPARTMENT_OTHER): Payer: Self-pay | Admitting: *Deleted

## 2015-10-06 ENCOUNTER — Other Ambulatory Visit: Payer: Self-pay | Admitting: Plastic Surgery

## 2015-10-06 DIAGNOSIS — N651 Disproportion of reconstructed breast: Secondary | ICD-10-CM

## 2015-10-06 NOTE — H&P (Signed)
Debra Lowery is an 40 y.o. female.   Chief Complaint: breast asymmetry HPI: The patient is a 40 yrs old female here for pre-operative history and physical for bilateral breast lipo-filling and right implant repositioning.  She underwent an immediate breast reconstruction following bilateral NAC/skin sparing mastecomies on 2/4/16and then exchange surgery with bilateral 345 cc anatomic silicone implants on 10/16/2014. The next stage was delayed due to an unexpected pregnancy and then miscarriage. She is doing much better and would like to move ahead with the reconstruction. She has mild right implant rotation to the lateral aspect and upper pole loss.  History: She has a strong family history of breast cancer and known familial BRCA1 mutation. She is not a smoker and is in good health. She is G4P4 1 miscarriage, first live birth at age 26. She took oral contraceptive for 5 years. She has not used HRT in the past. She had grade III ptosis of bilateral breast likely related to the breast feeding. She had loss of upper pole fullness. She underwent nipple sparing mastectomies with expander and FlexHD placement. She is 5 feet 4 inches tall, weighs 150 pounds and preop = 34C bra.    Past Medical History  Diagnosis Date  . BRCA gene positive     BRCA 1  . Mass of breast, left   . Pre-eclampsia in third trimester   . Bronchitis   . Wears contact lenses   . Anemia   . BRCA1 positive 04/17/2013  . History of kidney stones   . Cancer (HCC)     left breast cancer    Past Surgical History  Procedure Laterality Date  . Knee arthroscopy      left  . Tubal ligation    . Tubal reversal    . Colonoscopy    . Wisdom teeth extraction    . Breast reconstruction with placement of tissue expander and flex hd (acellular hydrated dermis) Bilateral 07/18/2014    Procedure: IMMEDIATE BILATERAL BREAST RECONSTRUCTION WITH PLACEMENT OF TISSUE EXPANDER AND FLEX HD (ACELLULAR HYDRATED DERMIS);  Surgeon: Rodel Glaspy  Sanger, DO;  Location: [facility] OR;  Service: Plastics;  Laterality: Bilateral;  . Removal of bilateral tissue expanders with placement of bilateral breast implants Bilateral 10/16/2014    Procedure: REMOVAL OF BILATERAL TISSUE EXPANDERS WITH PLACEMENT OF BILATERAL BREAST IMPLANTS;  Surgeon: Shaylyn Bawa Sanger, DO;  Location: [facility] OR;  Service: Plastics;  Laterality: Bilateral;  . Mastectomy Bilateral   . Dilation and curettage of uterus  2016    D&E    Family History  Problem Relation Age of Onset  . Breast cancer Mother 60  . Breast cancer Paternal Aunt 52  . Breast cancer Maternal Grandmother 39  . Esophageal cancer Maternal Grandmother   . Prostate cancer Maternal Grandfather 78   Social History:  reports that she has never smoked. She has never used smokeless tobacco. She reports that she does not drink alcohol or use illicit drugs.  Allergies: No Known Allergies   (Not in a hospital admission)  No results found for this or any previous visit (from the past 48 hour(s)). No results found.  Review of Systems  Constitutional: Negative.   HENT: Negative.   Eyes: Negative.   Respiratory: Negative.   Cardiovascular: Negative.   Gastrointestinal: Negative.   Genitourinary: Negative.   Musculoskeletal: Negative.   Skin: Negative.   Neurological: Negative.   Psychiatric/Behavioral: Negative.     Last menstrual period 09/14/2015, unknown if currently breastfeeding. Physical Exam  Constitutional: She   is oriented to person, place, and time. She appears well-developed and well-nourished.  HENT:  Head: Normocephalic and atraumatic.  Eyes: Conjunctivae and EOM are normal. Pupils are equal, round, and reactive to light.  Cardiovascular: Normal rate.   Respiratory: Effort normal. No respiratory distress.  GI: Soft. She exhibits no distension.  Neurological: She is alert and oriented to person, place, and time.  Skin: Skin is warm.  Psychiatric: She has a normal mood and affect. Her behavior  is normal. Judgment and thought content normal.     Assessment/Plan Revision of right reconstruction with implant repositioning and bilateral lipo-filling to the upper poles of both breasts.  The consent was obtained with risks and complications reviewed which included bleeding, pain, scar, infection, wound, possible need for further revision surgery, possible need for further implant positioning and the risk of anesthesia. The patients questions were answered to the patients expressed satisfaction.  Nicle Connole S Marrie Chandra, DO 10/06/2015, 3:01 PM    

## 2015-10-08 ENCOUNTER — Encounter (HOSPITAL_BASED_OUTPATIENT_CLINIC_OR_DEPARTMENT_OTHER)
Admission: RE | Disposition: A | Discharge status: Home / Self Care | Payer: Self-pay | Source: Ambulatory Visit | Attending: Plastic Surgery

## 2015-10-08 ENCOUNTER — Ambulatory Visit (HOSPITAL_BASED_OUTPATIENT_CLINIC_OR_DEPARTMENT_OTHER): Payer: BC Managed Care – PPO | Admitting: Anesthesiology

## 2015-10-08 ENCOUNTER — Ambulatory Visit (HOSPITAL_BASED_OUTPATIENT_CLINIC_OR_DEPARTMENT_OTHER)
Admission: RE | Admit: 2015-10-08 | Discharge: 2015-10-08 | Disposition: A | Discharge status: Home / Self Care | Payer: BC Managed Care – PPO | Source: Ambulatory Visit | Attending: Plastic Surgery | Admitting: Plastic Surgery

## 2015-10-08 ENCOUNTER — Encounter (HOSPITAL_BASED_OUTPATIENT_CLINIC_OR_DEPARTMENT_OTHER): Payer: Self-pay | Admitting: *Deleted

## 2015-10-08 DIAGNOSIS — Z853 Personal history of malignant neoplasm of breast: Secondary | ICD-10-CM | POA: Insufficient documentation

## 2015-10-08 DIAGNOSIS — N651 Disproportion of reconstructed breast: Secondary | ICD-10-CM | POA: Diagnosis not present

## 2015-10-08 DIAGNOSIS — Z9013 Acquired absence of bilateral breasts and nipples: Secondary | ICD-10-CM | POA: Insufficient documentation

## 2015-10-08 DIAGNOSIS — Z803 Family history of malignant neoplasm of breast: Secondary | ICD-10-CM | POA: Diagnosis not present

## 2015-10-08 HISTORY — PX: BREAST RECONSTRUCTION: SHX9

## 2015-10-08 HISTORY — PX: LIPOSUCTION WITH LIPOFILLING: SHX6436

## 2015-10-08 HISTORY — DX: Malignant (primary) neoplasm, unspecified: C80.1

## 2015-10-08 SURGERY — LIPOSUCTION, WITH FAT TRANSFER
Anesthesia: General | Laterality: Right

## 2015-10-08 MED ORDER — PHENYLEPHRINE HCL 10 MG/ML IJ SOLN
INTRAMUSCULAR | Status: DC | PRN
  Administered 2015-10-08: 40 ug via INTRAVENOUS

## 2015-10-08 MED ORDER — ONDANSETRON HCL 4 MG/2ML IJ SOLN
INTRAMUSCULAR | Status: AC
  Filled 2015-10-08: qty 2

## 2015-10-08 MED ORDER — MIDAZOLAM HCL 2 MG/2ML IJ SOLN
INTRAMUSCULAR | Status: AC
  Filled 2015-10-08: qty 2

## 2015-10-08 MED ORDER — DEXAMETHASONE SODIUM PHOSPHATE 10 MG/ML IJ SOLN
INTRAMUSCULAR | Status: AC
  Filled 2015-10-08: qty 1

## 2015-10-08 MED ORDER — LACTATED RINGERS IV SOLN
INTRAVENOUS | Status: DC
  Administered 2015-10-08 (×3): via INTRAVENOUS

## 2015-10-08 MED ORDER — FENTANYL CITRATE (PF) 100 MCG/2ML IJ SOLN
INTRAMUSCULAR | Status: AC
  Filled 2015-10-08: qty 2

## 2015-10-08 MED ORDER — LIDOCAINE HCL (CARDIAC) 20 MG/ML IV SOLN
INTRAVENOUS | Status: DC | PRN
  Administered 2015-10-08: 100 mg via INTRAVENOUS

## 2015-10-08 MED ORDER — MEPERIDINE HCL 25 MG/ML IJ SOLN: 6.2500 mg | INTRAMUSCULAR | Status: DC | PRN

## 2015-10-08 MED ORDER — HYDROMORPHONE HCL 1 MG/ML IJ SOLN
INTRAMUSCULAR | Status: AC
  Filled 2015-10-08: qty 1

## 2015-10-08 MED ORDER — CEFAZOLIN SODIUM-DEXTROSE 2-4 GM/100ML-% IV SOLN
INTRAVENOUS | Status: AC
  Filled 2015-10-08: qty 100

## 2015-10-08 MED ORDER — GLYCOPYRROLATE 0.2 MG/ML IJ SOLN: 0.2000 mg | Freq: Once | INTRAMUSCULAR | Status: DC | PRN

## 2015-10-08 MED ORDER — MIDAZOLAM HCL 2 MG/2ML IJ SOLN
1.0000 mg | INTRAMUSCULAR | Status: DC | PRN
  Administered 2015-10-08: 2 mg via INTRAVENOUS

## 2015-10-08 MED ORDER — HYDROMORPHONE HCL 1 MG/ML IJ SOLN
0.2500 mg | INTRAMUSCULAR | Status: DC | PRN
  Administered 2015-10-08 (×3): 0.5 mg via INTRAVENOUS

## 2015-10-08 MED ORDER — PROPOFOL 10 MG/ML IV BOLUS
INTRAVENOUS | Status: DC | PRN
  Administered 2015-10-08: 150 mg via INTRAVENOUS

## 2015-10-08 MED ORDER — DEXAMETHASONE SODIUM PHOSPHATE 4 MG/ML IJ SOLN
INTRAMUSCULAR | Status: DC | PRN
  Administered 2015-10-08: 10 mg via INTRAVENOUS

## 2015-10-08 MED ORDER — ONDANSETRON HCL 4 MG/2ML IJ SOLN: 4.0000 mg | Freq: Once | INTRAMUSCULAR | Status: DC | PRN

## 2015-10-08 MED ORDER — SCOPOLAMINE 1 MG/3DAYS TD PT72: 1.0000 | MEDICATED_PATCH | Freq: Once | TRANSDERMAL | Status: DC | PRN

## 2015-10-08 MED ORDER — LIDOCAINE HCL 1 % IJ SOLN
INTRAMUSCULAR | Status: DC | PRN
  Administered 2015-10-08: 900 mL

## 2015-10-08 MED ORDER — BUPIVACAINE-EPINEPHRINE 0.25% -1:200000 IJ SOLN
INTRAMUSCULAR | Status: DC | PRN
  Administered 2015-10-08: 6 mL

## 2015-10-08 MED ORDER — CEFAZOLIN SODIUM-DEXTROSE 2-4 GM/100ML-% IV SOLN
2.0000 g | INTRAVENOUS | Status: AC
  Administered 2015-10-08: 2 g via INTRAVENOUS

## 2015-10-08 MED ORDER — FENTANYL CITRATE (PF) 100 MCG/2ML IJ SOLN
50.0000 ug | INTRAMUSCULAR | Status: AC | PRN
  Administered 2015-10-08: 25 ug via INTRAVENOUS
  Administered 2015-10-08: 100 ug via INTRAVENOUS
  Administered 2015-10-08 (×5): 25 ug via INTRAVENOUS
  Administered 2015-10-08: 50 ug via INTRAVENOUS

## 2015-10-08 MED ORDER — ONDANSETRON HCL 4 MG/2ML IJ SOLN
INTRAMUSCULAR | Status: DC | PRN
  Administered 2015-10-08: 4 mg via INTRAVENOUS

## 2015-10-08 SURGICAL SUPPLY — 82 items
ADH SKN CLS APL DERMABOND .7 (GAUZE/BANDAGES/DRESSINGS)
BAG DECANTER FOR FLEXI CONT (MISCELLANEOUS) ×4 IMPLANT
BINDER ABDOMINAL  9 SM 30-45 (SOFTGOODS)
BINDER ABDOMINAL 10 UNV 27-48 (MISCELLANEOUS) ×2 IMPLANT
BINDER ABDOMINAL 12 SM 30-45 (SOFTGOODS) IMPLANT
BINDER ABDOMINAL 9 SM 30-45 (SOFTGOODS) IMPLANT
BINDER BREAST LRG (GAUZE/BANDAGES/DRESSINGS) ×2 IMPLANT
BINDER BREAST MEDIUM (GAUZE/BANDAGES/DRESSINGS) IMPLANT
BINDER BREAST XLRG (GAUZE/BANDAGES/DRESSINGS) IMPLANT
BINDER BREAST XXLRG (GAUZE/BANDAGES/DRESSINGS) IMPLANT
BIOPATCH RED 1 DISK 7.0 (GAUZE/BANDAGES/DRESSINGS) IMPLANT
BIOPATCH RED 1IN DISK 7.0MM (GAUZE/BANDAGES/DRESSINGS)
BLADE HEX COATED 2.75 (ELECTRODE) ×4 IMPLANT
BLADE SURG 15 STRL LF DISP TIS (BLADE) ×2 IMPLANT
BLADE SURG 15 STRL SS (BLADE) ×4
BNDG GAUZE ELAST 4 BULKY (GAUZE/BANDAGES/DRESSINGS) ×8 IMPLANT
CANISTER SUCT 1200ML W/VALVE (MISCELLANEOUS) ×4 IMPLANT
CHLORAPREP W/TINT 26ML (MISCELLANEOUS) ×4 IMPLANT
COVER BACK TABLE 60X90IN (DRAPES) ×4 IMPLANT
COVER MAYO STAND STRL (DRAPES) ×4 IMPLANT
DECANTER SPIKE VIAL GLASS SM (MISCELLANEOUS) IMPLANT
DERMABOND ADVANCED (GAUZE/BANDAGES/DRESSINGS)
DERMABOND ADVANCED .7 DNX12 (GAUZE/BANDAGES/DRESSINGS) IMPLANT
DRAIN CHANNEL 19F RND (DRAIN) IMPLANT
DRAPE LAPAROSCOPIC ABDOMINAL (DRAPES) ×4 IMPLANT
DRSG PAD ABDOMINAL 8X10 ST (GAUZE/BANDAGES/DRESSINGS) ×14 IMPLANT
ELECT BLADE 4.0 EZ CLEAN MEGAD (MISCELLANEOUS) ×4
ELECT BLADE 6.5 .24CM SHAFT (ELECTRODE) IMPLANT
ELECT REM PT RETURN 9FT ADLT (ELECTROSURGICAL) ×4
ELECTRODE BLDE 4.0 EZ CLN MEGD (MISCELLANEOUS) ×2 IMPLANT
ELECTRODE REM PT RTRN 9FT ADLT (ELECTROSURGICAL) ×2 IMPLANT
EVACUATOR SILICONE 100CC (DRAIN) IMPLANT
EXTRACTOR CANIST REVOLVE STRL (CANNISTER) ×4 IMPLANT
FILTER LIPOSUCTION (MISCELLANEOUS) ×4 IMPLANT
GLOVE BIO SURGEON STRL SZ 6.5 (GLOVE) ×6 IMPLANT
GLOVE BIO SURGEONS STRL SZ 6.5 (GLOVE) ×2
GLOVE BIOGEL PI IND STRL 7.0 (GLOVE) IMPLANT
GLOVE BIOGEL PI INDICATOR 7.0 (GLOVE) ×2
GLOVE ECLIPSE 6.5 STRL STRAW (GLOVE) ×2 IMPLANT
GOWN STRL REUS W/ TWL LRG LVL3 (GOWN DISPOSABLE) ×6 IMPLANT
GOWN STRL REUS W/TWL LRG LVL3 (GOWN DISPOSABLE) ×12
IV LACTATED RINGERS 1000ML (IV SOLUTION) ×4 IMPLANT
IV NS 500ML (IV SOLUTION)
IV NS 500ML BAXH (IV SOLUTION) ×2 IMPLANT
KIT FILL SYSTEM UNIVERSAL (SET/KITS/TRAYS/PACK) IMPLANT
LINER CANISTER 1000CC FLEX (MISCELLANEOUS) ×4 IMPLANT
LIQUID BAND (GAUZE/BANDAGES/DRESSINGS) ×2 IMPLANT
NDL HYPO 25X1 1.5 SAFETY (NEEDLE) IMPLANT
NDL SAFETY ECLIPSE 18X1.5 (NEEDLE) ×2 IMPLANT
NEEDLE HYPO 18GX1.5 SHARP (NEEDLE) ×4
NEEDLE HYPO 25X1 1.5 SAFETY (NEEDLE) ×4 IMPLANT
NS IRRIG 1000ML POUR BTL (IV SOLUTION) IMPLANT
PACK BASIN DAY SURGERY FS (CUSTOM PROCEDURE TRAY) ×4 IMPLANT
PAD ALCOHOL SWAB (MISCELLANEOUS) ×4 IMPLANT
PENCIL BUTTON HOLSTER BLD 10FT (ELECTRODE) ×4 IMPLANT
PIN SAFETY STERILE (MISCELLANEOUS) IMPLANT
SLEEVE SCD COMPRESS KNEE MED (MISCELLANEOUS) ×4 IMPLANT
SPONGE GAUZE 4X4 12PLY STER LF (GAUZE/BANDAGES/DRESSINGS) IMPLANT
SPONGE LAP 18X18 X RAY DECT (DISPOSABLE) ×10 IMPLANT
SUT MNCRL AB 4-0 PS2 18 (SUTURE) ×2 IMPLANT
SUT MON AB 3-0 SH 27 (SUTURE) ×4
SUT MON AB 3-0 SH27 (SUTURE) ×2 IMPLANT
SUT MON AB 5-0 PS2 18 (SUTURE) ×8 IMPLANT
SUT PDS 3-0 CT2 (SUTURE)
SUT PDS AB 2-0 CT2 27 (SUTURE) IMPLANT
SUT PDS II 3-0 CT2 27 ABS (SUTURE) IMPLANT
SUT SILK 3 0 PS 1 (SUTURE) IMPLANT
SUT VIC AB 3-0 SH 27 (SUTURE)
SUT VIC AB 3-0 SH 27X BRD (SUTURE) IMPLANT
SUT VICRYL 4-0 PS2 18IN ABS (SUTURE) IMPLANT
SYR 20CC LL (SYRINGE) ×2 IMPLANT
SYR 3ML 18GX1 1/2 (SYRINGE) IMPLANT
SYR 50ML LL SCALE MARK (SYRINGE) ×6 IMPLANT
SYR BULB IRRIGATION 50ML (SYRINGE) ×4 IMPLANT
SYR CONTROL 10ML LL (SYRINGE) ×2 IMPLANT
TOWEL OR 17X24 6PK STRL BLUE (TOWEL DISPOSABLE) ×8 IMPLANT
TUBE CONNECTING 20'X1/4 (TUBING) ×1
TUBE CONNECTING 20X1/4 (TUBING) ×3 IMPLANT
TUBING INFILTRATION IT-10001 (TUBING) IMPLANT
TUBING SET GRADUATE ASPIR 12FT (MISCELLANEOUS) ×4 IMPLANT
UNDERPAD 30X30 (UNDERPADS AND DIAPERS) ×8 IMPLANT
YANKAUER SUCT BULB TIP NO VENT (SUCTIONS) ×4 IMPLANT

## 2015-10-08 NOTE — Brief Op Note (Addendum)
10/08/2015  3:28 PM  PATIENT:  Debra Lowery  40 y.o. female  PRE-OPERATIVE DIAGNOSIS:  ACQUIRED ABSCENCEOF BILATERAL  BREAST, STATUS POST BILATERAL BREAST RECONSTRUCTION  POST-OPERATIVE DIAGNOSIS:  * No post-op diagnosis entered *  PROCEDURE:  Procedure(s): BILATERAL BREAST LIPO FILLING  (Bilateral) REPOSITIONING OF RIGHT BREAST IMPLANT (Right)  SURGEON:  Surgeon(s) and Role:    * Cully Luckow S Crissy Mccreadie, DO - Primary  ASSISTANTS: none   ANESTHESIA:   general  EBL:  Total I/O In: 2000 [I.V.:2000] Out: 10 [Blood:10]  BLOOD ADMINISTERED:none  DRAINS: none   LOCAL MEDICATIONS USED:  MARCAINE  and LIDOCAINE   SPECIMEN:  none  DISPOSITION OF SPECIMEN:  none  COUNTS:  YES  TOURNIQUET:  * No tourniquets in log *  DICTATION: .Dragon Dictation  PLAN OF CARE: Discharge to home after PACU  PATIENT DISPOSITION:  PACU - hemodynamically stable.   Delay start of Pharmacological VTE agent (>24hrs) due to surgical blood loss or risk of bleeding: no

## 2015-10-08 NOTE — Discharge Instructions (Signed)
Drink water Up walking No heavy lifting spanx or binders Breast binder or sports bra     Post Anesthesia Home Care Instructions  Activity: Get plenty of rest for the remainder of the day. A responsible adult should stay with you for 24 hours following the procedure.  For the next 24 hours, DO NOT: -Drive a car -Paediatric nurse -Drink alcoholic beverages -Take any medication unless instructed by your physician -Make any legal decisions or sign important papers.  Meals: Start with liquid foods such as gelatin or soup. Progress to regular foods as tolerated. Avoid greasy, spicy, heavy foods. If nausea and/or vomiting occur, drink only clear liquids until the nausea and/or vomiting subsides. Call your physician if vomiting continues.  Special Instructions/Symptoms: Your throat may feel dry or sore from the anesthesia or the breathing tube placed in your throat during surgery. If this causes discomfort, gargle with warm salt water. The discomfort should disappear within 24 hours.  If you had a scopolamine patch placed behind your ear for the management of post- operative nausea and/or vomiting:  1. The medication in the patch is effective for 72 hours, after which it should be removed.  Wrap patch in a tissue and discard in the trash. Wash hands thoroughly with soap and water. 2. You may remove the patch earlier than 72 hours if you experience unpleasant side effects which may include dry mouth, dizziness or visual disturbances. 3. Avoid touching the patch. Wash your hands with soap and water after contact with the patch.

## 2015-10-08 NOTE — Op Note (Signed)
Op note   DATE OF PROCEDURE: 10/08/2015  LOCATION: [facility]  SURGEON: Lyndee Leo Sanger Dillingham, DO  PREOPERATIVE DIAGNOSIS 1. History of breast cancer 2. Breast asymmetry after reconstruction  POSTOPERATIVE DIAGNOSIS same  PROCEDURES Capsular contracture release of left breast.  Bilateral breast lipofilling for symmetry.   COMPLICATIONS: None.  DRAINS: none  INDICATIONS FOR PROCEDURE [name] is a 40 y.o. year-old female born on 1976-02-19,with a history of breast cancer with bilateral immediate reconstruction with implants.  She had asymmetry after the reconstruction and contracture of the left breast.     MRN: [id number]  CONSENT Informed consent was obtained directly from the patient. The risks, benefits and alternatives were fully discussed. Specific risks including but not limited to bleeding, infection, hematoma, seroma, scarring, pain, nipple necrosis, asymmetry, poor cosmetic results, and need for further surgery were discussed. There is also the risk of loss of implant and need for further lipofilling.  The patient had ample opportunity to have her questions answered to her satisfaction.  DESCRIPTION OF PROCEDURE  Patient was brought into the operating room and placed in a supine position.  SCDs were placed and appropriate padding was performed.  Antibiotics were given. The patient underwent general anesthesia and the chest was prepped and draped in a sterile fashion.  A timeout was performed and all information was confirmed to be correct.  Tumescent was placed in the abdominal area.  After waiting several minutes for the local to take effect the liposuction was done.  The Revolve system was utiliized to harvest the fat.  The fat was prepared directed by the guidelines.  The probe was used on the left breast to release the scar contracture of the capsule.  Local was injected at the medial right breast and lateral left breast where the #15  blade was used to make a small incision to introduce the fat.  The fat (150 cc) was then introduced to the left breast in the low areas that need filling.  This included the medial and medial superior area.  The right breast had the fat (150 cc) placed in the superior and inferior portion. The incision sites were closed with 5-0 Monocryl sutures.  Dermabond was applied.  A breast binder and ABDs were placed.  An abdominal binder was placed.  The nipple and skin flaps had good capillary refill at the end of the procedure.  The patient tolerated the procedure well. The patient was allowed to wake from anesthesia and taken to the recovery room in satisfactory condition

## 2015-10-08 NOTE — Anesthesia Postprocedure Evaluation (Signed)
Anesthesia Post Note  Patient: MAURITA LEIMAN  Procedure(s) Performed: Procedure(s) (LRB): BILATERAL BREAST LIPO FILLING  (Bilateral) REPOSITIONING OF RIGHT BREAST IMPLANT (Right)  Patient location during evaluation: PACU Anesthesia Type: General Level of consciousness: awake and alert Pain management: pain level controlled Vital Signs Assessment: post-procedure vital signs reviewed and stable Respiratory status: spontaneous breathing, nonlabored ventilation, respiratory function stable and patient connected to nasal cannula oxygen Cardiovascular status: blood pressure returned to baseline and stable Postop Assessment: no signs of nausea or vomiting Anesthetic complications: no    Last Vitals:  Filed Vitals:   10/08/15 1600 10/08/15 1615  BP: 109/67 111/71  Pulse: 62 63  Temp:    Resp: 18 10    Last Pain:  Filed Vitals:   10/08/15 1619  PainSc: 2                  Coy Vandoren DAVID

## 2015-10-08 NOTE — Anesthesia Preprocedure Evaluation (Signed)
Anesthesia Evaluation  Patient identified by MRN, date of birth, ID band Patient awake    Reviewed: Allergy & Precautions, NPO status , Patient's Chart, lab work & pertinent test results  Airway Mallampati: I  TM Distance: >3 FB Neck ROM: Full    Dental   Pulmonary    Pulmonary exam normal        Cardiovascular hypertension, Pt. on medications Normal cardiovascular exam     Neuro/Psych    GI/Hepatic   Endo/Other    Renal/GU      Musculoskeletal   Abdominal   Peds  Hematology   Anesthesia Other Findings   Reproductive/Obstetrics                             Anesthesia Physical Anesthesia Plan  ASA: II  Anesthesia Plan: General   Post-op Pain Management:    Induction: Intravenous  Airway Management Planned: LMA  Additional Equipment:   Intra-op Plan:   Post-operative Plan: Extubation in OR  Informed Consent: I have reviewed the patients History and Physical, chart, labs and discussed the procedure including the risks, benefits and alternatives for the proposed anesthesia with the patient or authorized representative who has indicated his/her understanding and acceptance.     Plan Discussed with: CRNA and Surgeon  Anesthesia Plan Comments:         Anesthesia Quick Evaluation  

## 2015-10-08 NOTE — Anesthesia Procedure Notes (Signed)
Procedure Name: LMA Insertion Date/Time: 10/08/2015 1:40 PM Performed by: Maryella Shivers Pre-anesthesia Checklist: Patient identified, Emergency Drugs available, Suction available and Patient being monitored Patient Re-evaluated:Patient Re-evaluated prior to inductionOxygen Delivery Method: Circle System Utilized Preoxygenation: Pre-oxygenation with 100% oxygen Intubation Type: IV induction Ventilation: Mask ventilation without difficulty LMA: LMA inserted LMA Size: 4.0 Number of attempts: 1 Airway Equipment and Method: Bite block Placement Confirmation: positive ETCO2 Tube secured with: Tape Dental Injury: Teeth and Oropharynx as per pre-operative assessment

## 2015-10-08 NOTE — Transfer of Care (Signed)
Immediate Anesthesia Transfer of Care Note  Patient: Debra Lowery  Procedure(s) Performed: Procedure(s): BILATERAL BREAST LIPO FILLING  (Bilateral) REPOSITIONING OF RIGHT BREAST IMPLANT (Right)  Patient Location: PACU  Anesthesia Type:General  Level of Consciousness: sedated  Airway & Oxygen Therapy: Patient Spontanous Breathing and Patient connected to face mask oxygen  Post-op Assessment: Report given to RN and Post -op Vital signs reviewed and stable  Post vital signs: Reviewed and stable  Last Vitals:  Filed Vitals:   10/08/15 1152  BP: 128/73  Pulse: 60  Temp: 36.6 C  Resp: 18    Last Pain: There were no vitals filed for this visit.       Complications: No apparent anesthesia complications

## 2015-10-09 ENCOUNTER — Encounter (HOSPITAL_BASED_OUTPATIENT_CLINIC_OR_DEPARTMENT_OTHER): Payer: Self-pay | Admitting: Plastic Surgery

## 2015-10-09 NOTE — Interval H&P Note (Signed)
History and Physical Interval Note:  10/09/2015 7:34 AM  Debra Lowery  has presented today for surgery, with the diagnosis of ACQUIRED ABSCENCEOF BILATERAL  BREAST, STATUS POST BILATERAL BREAST RECONSTRUCTION  The various methods of treatment have been discussed with the patient and family. After consideration of risks, benefits and other options for treatment, the patient has consented to  Procedure(s): BILATERAL BREAST LIPO FILLING  (Bilateral) REPOSITIONING OF RIGHT BREAST IMPLANT (Right) as a surgical intervention .  The patient's history has been reviewed, patient examined, no change in status, stable for surgery.  I have reviewed the patient's chart and labs.  Questions were answered to the patient's satisfaction.     Wallace Going

## 2015-10-09 NOTE — H&P (View-Only) (Signed)
Debra Lowery is an 40 y.o. female.   Chief Complaint: breast asymmetry HPI: The patient is a 40 yrs old female here for pre-operative history and physical for bilateral breast lipo-filling and right implant repositioning.  She underwent an immediate breast reconstruction following bilateral NAC/skin sparing mastecomies on 2/4/16and then exchange surgery with bilateral 832 cc anatomic silicone implants on 10/15/9824. The next stage was delayed due to an unexpected pregnancy and then miscarriage. She is doing much better and would like to move ahead with the reconstruction. She has mild right implant rotation to the lateral aspect and upper pole loss.  History: She has a strong family history of breast cancer and known familial BRCA1 mutation. She is not a smoker and is in good health. She is G4P4 1 miscarriage, first live birth at age 32. She took oral contraceptive for 5 years. She has not used HRT in the past. She had grade III ptosis of bilateral breast likely related to the breast feeding. She had loss of upper pole fullness. She underwent nipple sparing mastectomies with expander and FlexHD placement. She is 5 feet 4 inches tall, weighs 150 pounds and preop = 34C bra.    Past Medical History  Diagnosis Date  . BRCA gene positive     BRCA 1  . Mass of breast, left   . Pre-eclampsia in third trimester   . Bronchitis   . Wears contact lenses   . Anemia   . BRCA1 positive 04/17/2013  . History of kidney stones   . Cancer [facility])     left breast cancer    Past Surgical History  Procedure Laterality Date  . Knee arthroscopy      left  . Tubal ligation    . Tubal reversal    . Colonoscopy    . Wisdom teeth extraction    . Breast reconstruction with placement of tissue expander and flex hd (acellular hydrated dermis) Bilateral 07/18/2014    Procedure: IMMEDIATE BILATERAL BREAST RECONSTRUCTION WITH PLACEMENT OF TISSUE EXPANDER AND FLEX HD (ACELLULAR HYDRATED DERMIS);  Surgeon: Theodoro Kos, DO;  Location: [facility];  Service: Plastics;  Laterality: Bilateral;  . Removal of bilateral tissue expanders with placement of bilateral breast implants Bilateral 10/16/2014    Procedure: REMOVAL OF BILATERAL TISSUE EXPANDERS WITH PLACEMENT OF BILATERAL BREAST IMPLANTS;  Surgeon: Theodoro Kos, DO;  Location: [facility];  Service: Plastics;  Laterality: Bilateral;  . Mastectomy Bilateral   . Dilation and curettage of uterus  2016    D&E    Family History  Problem Relation Age of Onset  . Breast cancer Mother 76  . Breast cancer Paternal Aunt 44  . Breast cancer Maternal Grandmother 75  . Esophageal cancer Maternal Grandmother   . Prostate cancer Maternal Grandfather 58   Social History:  reports that she has never smoked. She has never used smokeless tobacco. She reports that she does not drink alcohol or use illicit drugs.  Allergies: No Known Allergies   (Not in a hospital admission)  No results found for this or any previous visit (from the past 48 hour(s)). No results found.  Review of Systems  Constitutional: Negative.   HENT: Negative.   Eyes: Negative.   Respiratory: Negative.   Cardiovascular: Negative.   Gastrointestinal: Negative.   Genitourinary: Negative.   Musculoskeletal: Negative.   Skin: Negative.   Neurological: Negative.   Psychiatric/Behavioral: Negative.     Last menstrual period 09/14/2015, unknown if currently breastfeeding. Physical Exam  Constitutional: She  is oriented to person, place, and time. She appears well-developed and well-nourished.  HENT:  Head: Normocephalic and atraumatic.  Eyes: Conjunctivae and EOM are normal. Pupils are equal, round, and reactive to light.  Cardiovascular: Normal rate.   Respiratory: Effort normal. No respiratory distress.  GI: Soft. She exhibits no distension.  Neurological: She is alert and oriented to person, place, and time.  Skin: Skin is warm.  Psychiatric: She has a normal mood and affect. Her behavior  is normal. Judgment and thought content normal.     Assessment/Plan Revision of right reconstruction with implant repositioning and bilateral lipo-filling to the upper poles of both breasts.  The consent was obtained with risks and complications reviewed which included bleeding, pain, scar, infection, wound, possible need for further revision surgery, possible need for further implant positioning and the risk of anesthesia. The patients questions were answered to the patients expressed satisfaction.  Wallace Going, DO 10/06/2015, 3:01 PM

## 2016-08-20 IMAGING — MR MR BREAST BILATERAL W WO CONTRAST
6 of 13 series · 23 of 48 positions shown · IV contrast (multihance)
Comparison: Mammograms dated 11/19/2013, 03/16/2013, 05/07/2011.

CLINICAL DATA: BRCA 1 positive patient. High risk screening
evaluation.

EXAM:
BILATERAL BREAST MRI WITH AND WITHOUT CONTRAST
TECHNIQUE: Multiplanar, multisequence MR images of both breasts were obtained
prior to and following the intravenous administration of 13ml of
MultiHance

[Series 2: T2 · axial · 3.0mm · 0.47mm/px · z∈[-72,+90]mm · 3 of 55 slices shown]
[im 1/55]
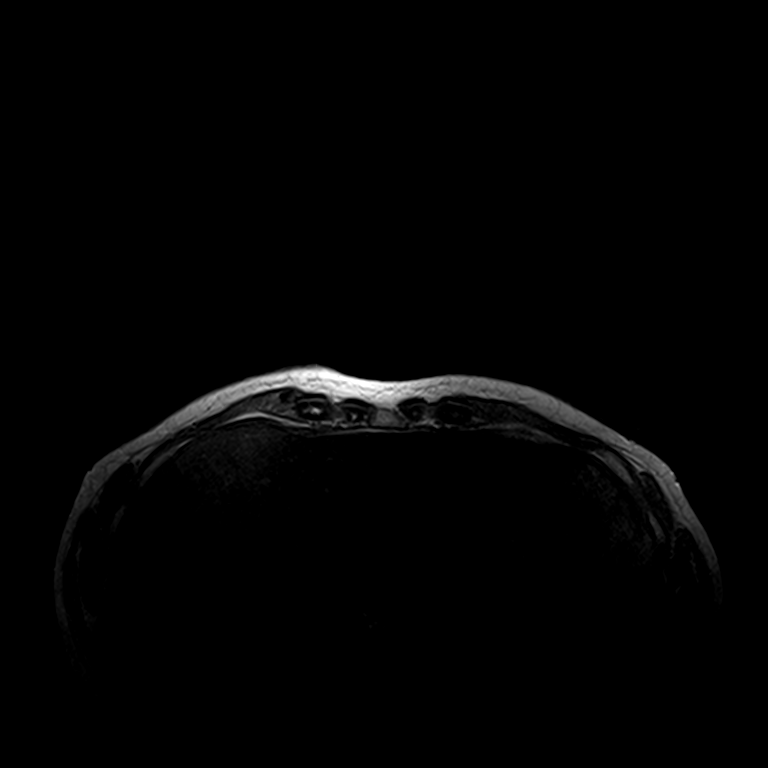
[im 28/55]
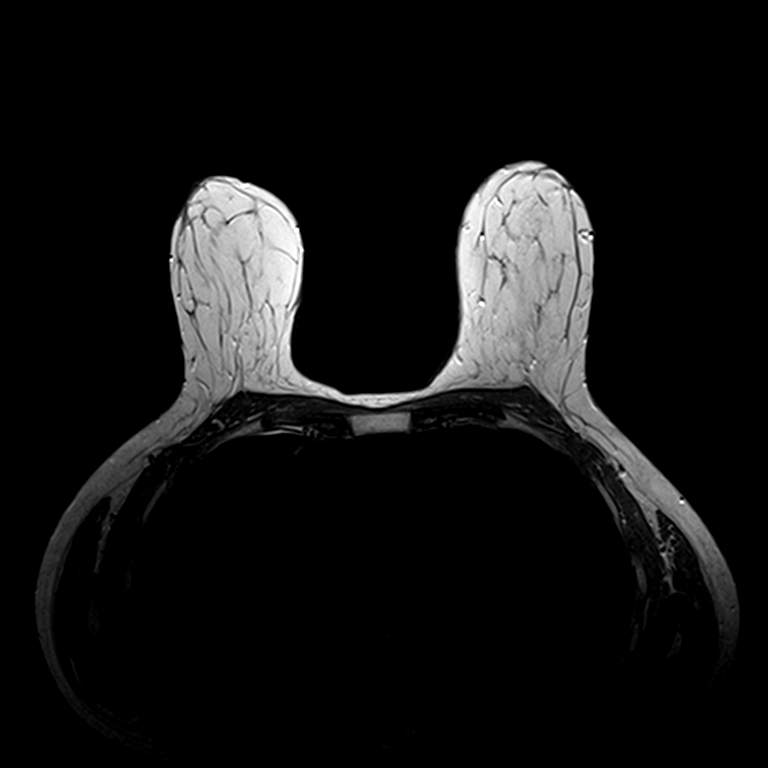
[im 55/55]
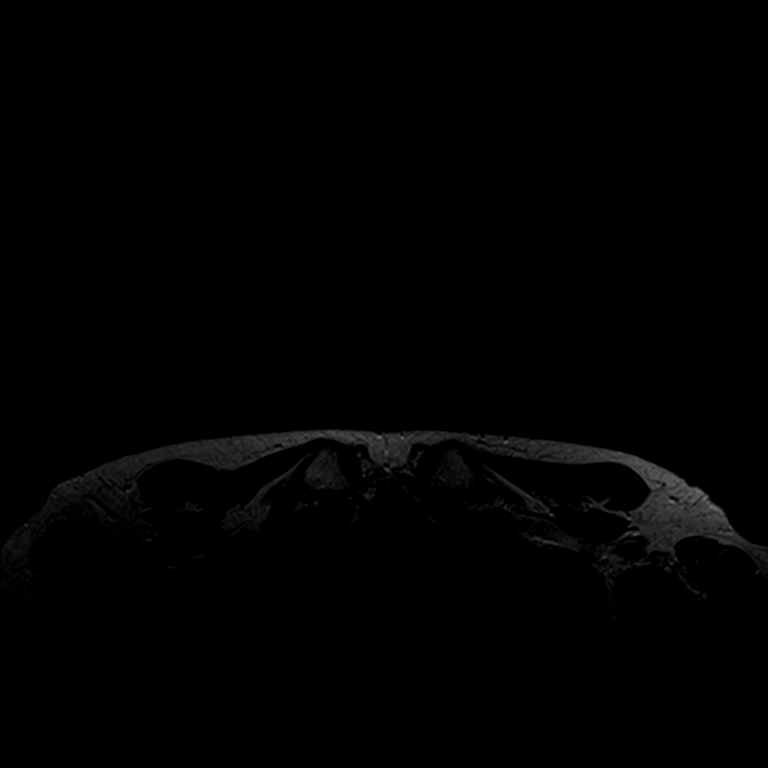

[Series 3: t2_tirm_tra ipat (a-p) · axial · 3.0mm · 0.70mm/px · z∈[-72,+90]mm · 2 of 55 slices shown]
[im 1/55]
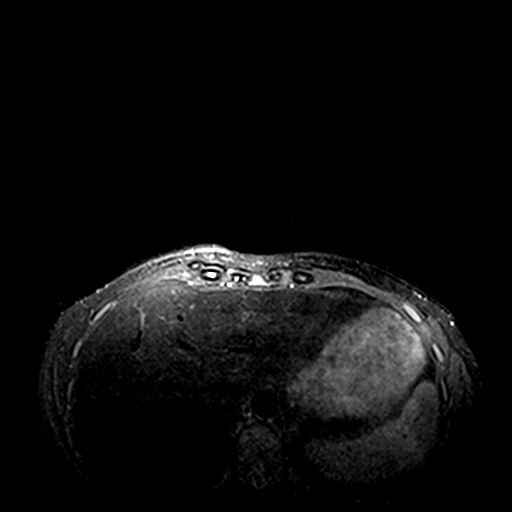
[im 55/55]
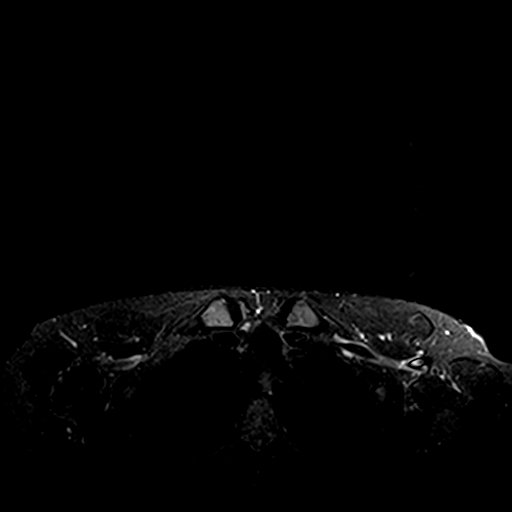

[Series 4: fl3d pre-cm no · axial · non-contrast · 1.2mm · 0.94mm/px · z∈[-77,+95]mm · 5 of 144 slices shown]
[im 1/144]
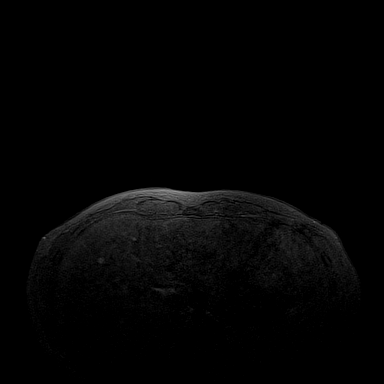
[im 36/144]
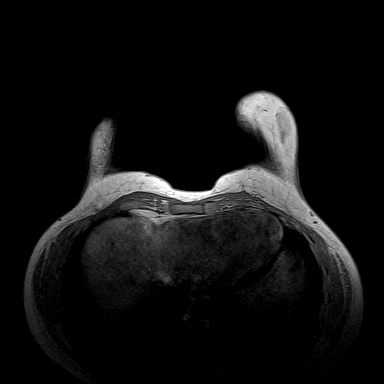
[im 72/144]
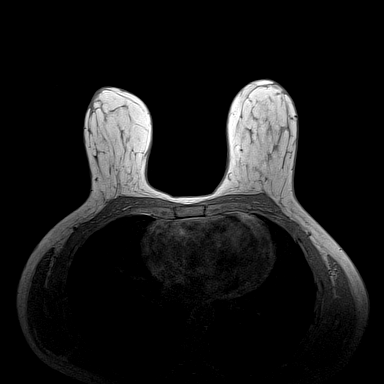
[im 108/144]
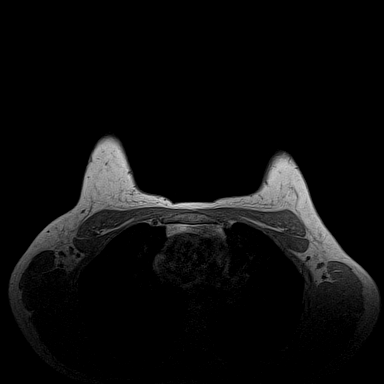
[im 144/144]
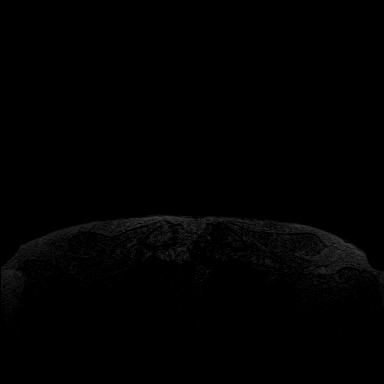

[Series 5: fl3d pre-cm · axial · non-contrast · 1.2mm · 0.94mm/px · z∈[-77,+95]mm · 5 of 144 slices shown]
[im 1/144]
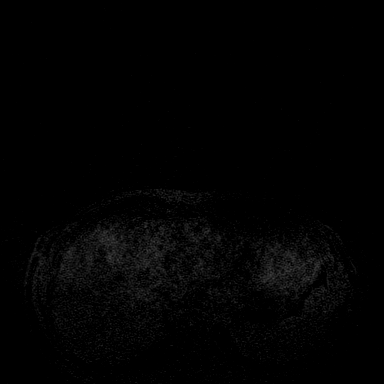
[im 36/144]
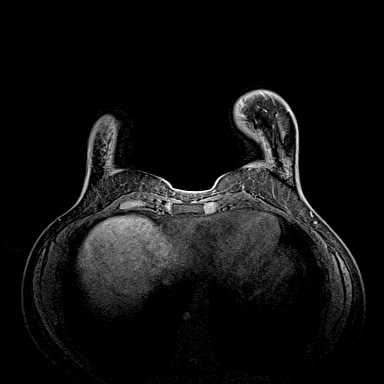
[im 72/144]
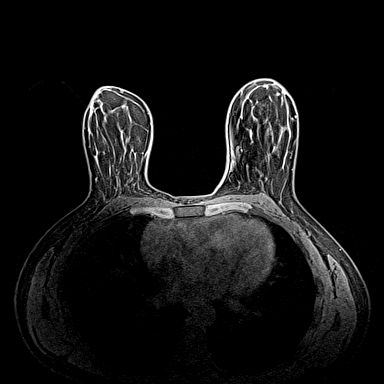
[im 108/144]
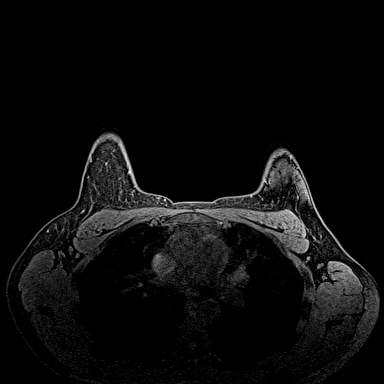
[im 144/144]
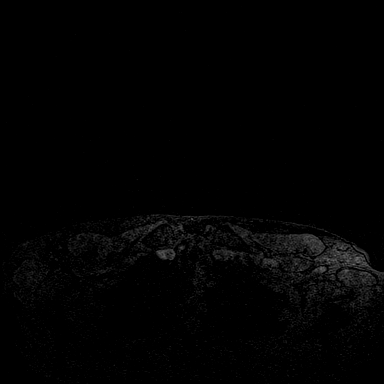

[Series 6: fl3d post immediate · axial · 1.2mm · 0.94mm/px · z∈[-77,+95]mm · 5 of 144 slices shown (1 of 2)]
[im 1/144]
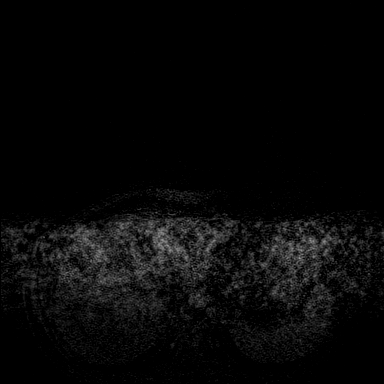
[im 36/144]
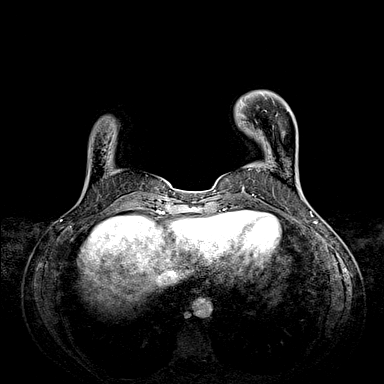
[im 72/144]
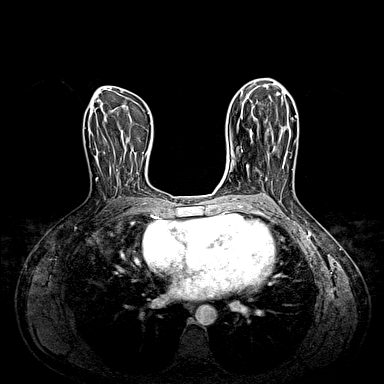
[im 108/144]
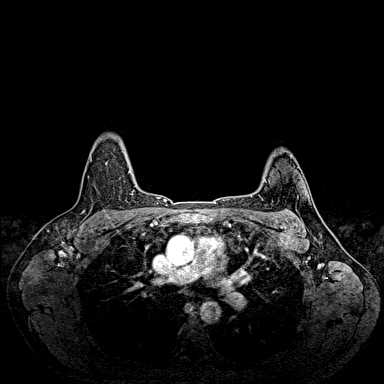
[im 144/144]
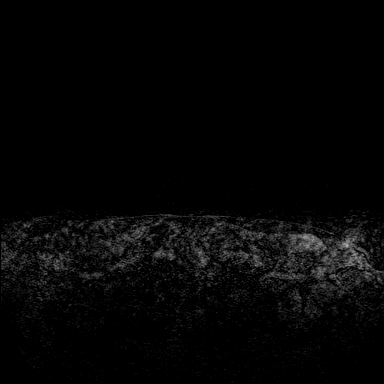

[Series 7: fl3d post immediate · axial · 1.2mm · 0.94mm/px · z∈[-77,+9]mm · 3 of 144 slices shown (2 of 2)]
[im 1/144]
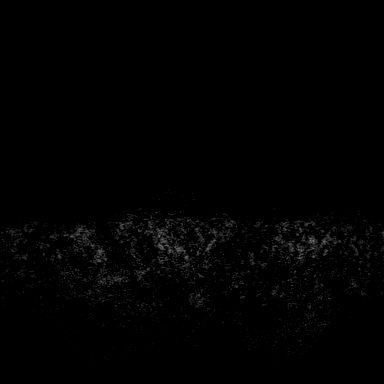
[im 36/144]
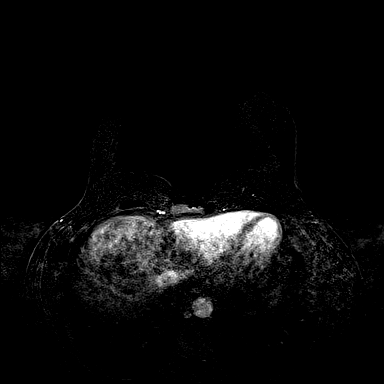
[im 72/144]
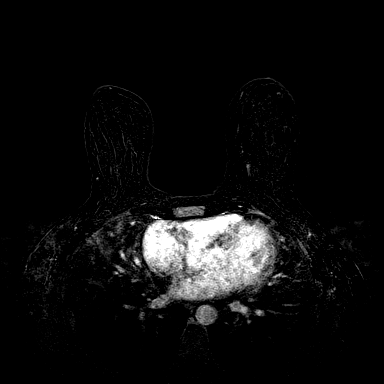

[23 of 48 positions shown; findings below may reference images not displayed]

THREE-DIMENSIONAL MR IMAGE RENDERING ON INDEPENDENT WORKSTATION:

Three-dimensional MR images were rendered by post-processing of the
original MR data on an independent workstation. The
three-dimensional MR images were interpreted, and findings are
reported in the following complete MRI report for this study. Three
dimensional images were evaluated at the independent [employer]
workstation
FINDINGS: Breast composition: b.  Scattered fibroglandular tissue.

Background parenchymal enhancement: Mild

Right breast: No mass or abnormal enhancement.

Left breast: There is an irregular enhancing mass located within the
posterior [DATE] of the upper inner quadrant of the left breast which
measures 1.8 x 1.1 x 0.9 cm in size. This is associated with a
mixture plateau and washout enhancement kinetics. Tissue sampling is
recommended. Left breast diagnostic mammogram and ultrasound is
recommended for further evaluation. There are no additional
worrisome enhancing foci within the left breast.

Lymph nodes: No abnormal appearing lymph nodes.

Ancillary findings:  None.
IMPRESSION: 1.8 cm suspicious enhancing mass located within the posterior [DATE] of
the upper inner quadrant of the left breast. Recommend left breast
diagnostic mammogram with ultrasound at this time with a left breast
ultrasound-guided core biopsy if there is an ultrasound correlate.
If there is no ultrasound correlate, recommend MR guided biopsy.

RECOMMENDATION:
Left breast diagnostic mammogram and ultrasound with possible left
breast ultrasound-guided core biopsy as discussed above.

BI-RADS CATEGORY  4: Suspicious.

## 2019-01-16 ENCOUNTER — Encounter: Payer: Self-pay | Admitting: Genetic Counselor

## 2020-04-22 ENCOUNTER — Other Ambulatory Visit: Payer: Self-pay

## 2020-04-22 ENCOUNTER — Encounter: Payer: Self-pay | Admitting: Plastic Surgery

## 2020-04-22 ENCOUNTER — Ambulatory Visit: Payer: BC Managed Care – PPO | Admitting: Plastic Surgery

## 2020-04-22 VITALS — BP 124/78 | HR 56 | Temp 98.1°F

## 2020-04-22 DIAGNOSIS — Z1501 Genetic susceptibility to malignant neoplasm of breast: Secondary | ICD-10-CM

## 2020-04-22 DIAGNOSIS — Z1509 Genetic susceptibility to other malignant neoplasm: Secondary | ICD-10-CM | POA: Diagnosis not present

## 2020-04-22 DIAGNOSIS — Z9013 Acquired absence of bilateral breasts and nipples: Secondary | ICD-10-CM | POA: Diagnosis not present

## 2020-04-23 ENCOUNTER — Encounter: Payer: Self-pay | Admitting: Plastic Surgery

## 2020-04-23 DIAGNOSIS — Z9013 Acquired absence of bilateral breasts and nipples: Secondary | ICD-10-CM | POA: Insufficient documentation

## 2020-04-23 NOTE — Progress Notes (Signed)
Patient ID: Debra Lowery, female    DOB: 1976-02-06, 44 y.o.   MRN: [id number]   Chief Complaint  Patient presents with  . Follow-up  . Breast Problem    The patient is a 44 year old female here for a 1 year follow-up on her bilateral breast reconstructions.  She was BRCA positive and underwent bilateral mastectomies that were skin and nipple sparing in 2016.  She has silicone implants that were placed in May 2016.  They are Mentor memory shaped breast implants 375 cc.  She is 5 feet 4 inches tall.  She has gained weight since her last visit.  Her abdomen is well healed from her laparoscopic hysterectomy.  She has had fat grafting for improved contour.  On exam today she has no masses.  There is a little bit of capsular contracture.  This has her concerned.  She also has noticed chronic fatigue and achiness.  She is worried about the implants.  The patient states that a friend of hers had her implants removed and her symptoms all corrected.   Review of Systems  Constitutional: Negative.  Negative for activity change and appetite change.  HENT: Negative.   Eyes: Negative.   Respiratory: Negative.  Negative for chest tightness and shortness of breath.   Cardiovascular: Negative for leg swelling.  Gastrointestinal: Negative.   Endocrine: Negative.   Genitourinary: Negative.   Musculoskeletal: Negative.   Neurological: Negative.   Hematological: Negative.     Past Medical History:  Diagnosis Date  . Anemia   . BRCA gene positive    BRCA 1  . BRCA1 positive 04/17/2013  . Bronchitis   . Cancer [facility])    left breast cancer  . History of kidney stones   . Mass of breast, left   . Pre-eclampsia in third trimester   . Wears contact lenses     Past Surgical History:  Procedure Laterality Date  . BREAST RECONSTRUCTION Right 10/08/2015   Procedure: REPOSITIONING OF RIGHT BREAST IMPLANT;  Surgeon: Wallace Going, DO;  Location: [facility];  Service: Plastics;   Laterality: Right;  . BREAST RECONSTRUCTION WITH PLACEMENT OF TISSUE EXPANDER AND FLEX HD (ACELLULAR HYDRATED DERMIS) Bilateral 07/18/2014   Procedure: IMMEDIATE BILATERAL BREAST RECONSTRUCTION WITH PLACEMENT OF TISSUE EXPANDER AND FLEX HD (ACELLULAR HYDRATED DERMIS);  Surgeon: Theodoro Kos, DO;  Location: [place];  Service: Plastics;  Laterality: Bilateral;  . COLONOSCOPY    . DILATION AND CURETTAGE OF UTERUS  2016   D&E  . KNEE ARTHROSCOPY     left  . LIPOSUCTION WITH LIPOFILLING Bilateral 10/08/2015   Procedure: BILATERAL BREAST LIPO FILLING ;  Surgeon: Wallace Going, DO;  Location: [facility];  Service: Plastics;  Laterality: Bilateral;  . MASTECTOMY Bilateral   . REMOVAL OF BILATERAL TISSUE EXPANDERS WITH PLACEMENT OF BILATERAL BREAST IMPLANTS Bilateral 10/16/2014   Procedure: REMOVAL OF BILATERAL TISSUE EXPANDERS WITH PLACEMENT OF BILATERAL BREAST IMPLANTS;  Surgeon: Theodoro Kos, DO;  Location: [facility];  Service: Plastics;  Laterality: Bilateral;  . TUBAL LIGATION    . tubal reversal    . wisdom teeth extraction        Current Outpatient Medications:  .  Cholecalciferol 50 MCG (2000 UT) CAPS, Take 1 capsule by mouth daily., Disp: , Rfl:  .  MAGNESIUM PO, Take by mouth., Disp: , Rfl:  .  loratadine (CLARITIN) 10 MG tablet, Take 10 mg by mouth daily., Disp: , Rfl:  .  oseltamivir (TAMIFLU)  75 MG capsule, Take 75 mg by mouth., Disp: , Rfl:    Objective:   Vitals:   04/22/20 1433  BP: 124/78  Pulse: (!) 56  Temp: 98.1 F (36.7 C)  SpO2: 100%    Physical Exam Vitals and nursing note reviewed.  Constitutional:      Appearance: Normal appearance.  HENT:     Head: Normocephalic and atraumatic.  Cardiovascular:     Rate and Rhythm: Normal rate.     Pulses: Normal pulses.  Pulmonary:     Effort: Pulmonary effort is normal.  Abdominal:     General: Abdomen is flat. There is no distension.     Tenderness: There is no abdominal tenderness.  Neurological:      General: No focal deficit present.     Mental Status: She is alert and oriented to person, place, and time. Mental status is at baseline.  Psychiatric:        Mood and Affect: Mood normal.        Behavior: Behavior normal.        Thought Content: Thought content normal.     Assessment & Plan:  BRCA1 positive  Acquired absence of both breasts  Recommend we start with ultrasound evaluation of the breast.  We may need to have MRIs done.  Patient is aware.  I will get the orders in.  We will then talk when she has the results back for our next plan.  Patient is considering removal of the implants.  Pictures were obtained of the patient and placed in the chart with the patient's or guardian's permission.  [place], DO

## 2020-04-29 ENCOUNTER — Other Ambulatory Visit: Payer: BC Managed Care – PPO

## 2020-05-01 ENCOUNTER — Other Ambulatory Visit: Payer: Self-pay | Admitting: Plastic Surgery

## 2020-05-01 ENCOUNTER — Ambulatory Visit
Admission: RE | Admit: 2020-05-01 | Discharge: 2020-05-01 | Disposition: A | Discharge status: Home / Self Care | Payer: BC Managed Care – PPO | Source: Ambulatory Visit | Attending: Plastic Surgery | Admitting: Plastic Surgery

## 2020-05-01 ENCOUNTER — Other Ambulatory Visit: Payer: Self-pay

## 2020-05-01 DIAGNOSIS — Z1509 Genetic susceptibility to other malignant neoplasm: Secondary | ICD-10-CM

## 2020-05-01 DIAGNOSIS — Z1501 Genetic susceptibility to malignant neoplasm of breast: Secondary | ICD-10-CM

## 2020-05-01 DIAGNOSIS — Z9013 Acquired absence of bilateral breasts and nipples: Secondary | ICD-10-CM

## 2020-05-02 ENCOUNTER — Encounter: Payer: Self-pay | Admitting: Plastic Surgery

## 2020-05-20 ENCOUNTER — Other Ambulatory Visit: Payer: Self-pay

## 2020-05-20 ENCOUNTER — Telehealth (INDEPENDENT_AMBULATORY_CARE_PROVIDER_SITE_OTHER): Payer: BC Managed Care – PPO | Admitting: Plastic Surgery

## 2020-05-20 ENCOUNTER — Encounter: Payer: Self-pay | Admitting: Plastic Surgery

## 2020-05-20 DIAGNOSIS — Z9013 Acquired absence of bilateral breasts and nipples: Secondary | ICD-10-CM

## 2020-05-20 DIAGNOSIS — Z1501 Genetic susceptibility to malignant neoplasm of breast: Secondary | ICD-10-CM | POA: Diagnosis not present

## 2020-05-20 NOTE — Progress Notes (Signed)
   Subjective:    Patient ID: Debra Lowery, female    DOB: 02/26/1976, 44 y.o.   MRN: [id number]  The patient is a 44 year old female joining me by telephone for a follow-up visit on her bilateral breast reconstruction.  She was found to be BRCA positive in 2016.  She underwent bilateral mastectomies with reconstruction.  She has Mentor memory shaped breast implants 375 cc.  She has gained weight and had a laparoscopic hysterectomy since her previous surgeries.  She saw me a few weeks ago with concerns of capsular contracture and possible implant sickness.  She states severe tiredness and aching.  She says she just feels exhausted and a friend of hers had the same thing.  She had her implants removed and feels a lot better.  The patient feels that she is uncomfortable enough that she would like to move forward with removal of her implants.  She had an ultrasound 11/18 which was negative.  There was no sign of rupture of the implants.     Review of Systems  Constitutional: Negative for activity change and appetite change.  Eyes: Negative for visual disturbance.  Respiratory: Negative for shortness of breath.   Cardiovascular: Negative for leg swelling.  Gastrointestinal: Negative for abdominal pain.  Musculoskeletal: Negative for back pain.       Objective:   Physical Exam      Assessment & Plan:     ICD-10-CM   1. Acquired absence of both breasts  Z90.13     Plan for removal of bilateral implants and capsules.  No implants will be placed per patient request.  She will most likely have a drain for a week on each side.  Patient understands the she may need a revision depending on how much her skin tightening is over the next 6 to 12 months.  The patient stated she was excited to have this taken care of.  I connected with  Debra Lowery on 47/34/03 by a phone application and verified that I am speaking with the correct person using two identifiers.  The patient was  at home and I was at the office.  We spent 10 minutes discussing the above information.   I discussed the limitations of evaluation and management by telemedicine. The patient expressed understanding and agreed to proceed.

## 2020-05-23 NOTE — H&P (View-Only) (Signed)
ICD-10-CM   1. Acquired absence of both breasts  Z90.13   2. BRCA1 positive  Z15.01    Z15.09       Patient ID: Debra Lowery, female    DOB: 01-29-1976, 44 y.o.   MRN: [id number]   History of Present Illness: Debra Lowery is a 44 y.o.  female  with a history of capsular contracture and implant sickness.  She presents for preoperative evaluation for upcoming procedure, bilateral removal of breast implants and capsulectomy, scheduled for 06/05/20 with Dr. Marla Roe.  Summary from previous visit: Patient was found to be BRCA positive in 2016 and underwent bilateral mastectomies with reconstruction. She has Mentor memory shaped breast implants 375 cc. She had concerns for capsular contracture and implant sickness. She reports severe tiredness and aching. Patient reports she is uncomfortable enough that she wants the implants removed. She had ultrasound on 11/18 which was negative, no signs of rupture of implants. Drains will most likely be placed. Patient understands she may need a revision depending on how much skin tightening occurs over the next 6-12 months.   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   Past Medical History: Allergies: No Known Allergies  Current Medications:  Current Outpatient Medications:  .  Cholecalciferol 50 MCG (2000 UT) CAPS, Take 1 capsule by mouth daily., Disp: , Rfl:  .  MAGNESIUM PO, Take by mouth., Disp: , Rfl:  .  Cyanocobalamin (B-12 PO), Take by mouth., Disp: , Rfl:  .  loratadine (CLARITIN) 10 MG tablet, Take 10 mg by mouth daily., Disp: , Rfl:  .  oseltamivir (TAMIFLU) 75 MG capsule, Take 75 mg by mouth., Disp: , Rfl:   Past Medical Problems: Past Medical History:  Diagnosis Date  . Anemia   . BRCA gene positive    BRCA 1  . BRCA1 positive 04/17/2013  . Bronchitis   . Cancer [facility])    left breast cancer   . History of kidney stones   . Mass of breast, left   . Pre-eclampsia in third trimester   . Wears contact lenses     Past Surgical History: Past Surgical History:  Procedure Laterality Date  . BREAST RECONSTRUCTION Right 10/08/2015   Procedure: REPOSITIONING OF RIGHT BREAST IMPLANT;  Surgeon: Wallace Going, DO;  Location: [place];  Service: Plastics;  Laterality: Right;  . BREAST RECONSTRUCTION WITH PLACEMENT OF TISSUE EXPANDER AND FLEX HD (ACELLULAR HYDRATED DERMIS) Bilateral 07/18/2014   Procedure: IMMEDIATE BILATERAL BREAST RECONSTRUCTION WITH PLACEMENT OF TISSUE EXPANDER AND FLEX HD (ACELLULAR HYDRATED DERMIS);  Surgeon: Theodoro Kos, DO;  Location: [place];  Service: Plastics;  Laterality: Bilateral;  . COLONOSCOPY    . DILATION AND CURETTAGE OF UTERUS  2016   D&E  . KNEE ARTHROSCOPY     left  . LIPOSUCTION WITH LIPOFILLING Bilateral 10/08/2015   Procedure: BILATERAL BREAST LIPO FILLING ;  Surgeon: Wallace Going, DO;  Location: [place];  Service: Plastics;  Laterality: Bilateral;  . MASTECTOMY Bilateral   . REMOVAL OF BILATERAL TISSUE EXPANDERS WITH PLACEMENT OF BILATERAL BREAST IMPLANTS Bilateral 10/16/2014   Procedure: REMOVAL OF BILATERAL TISSUE EXPANDERS WITH PLACEMENT OF BILATERAL BREAST IMPLANTS;  Surgeon: Theodoro Kos, DO;  Location: [place];  Service: Plastics;  Laterality: Bilateral;  . TUBAL LIGATION    . tubal reversal    . wisdom teeth extraction  Social History: Social History   Socioeconomic History  . Marital status: Married    Spouse name: Not on file  . Number of children: 4  . Years of education: Not on file  . Highest education level: Not on file  Occupational History  . Not on file  Tobacco Use  . Smoking status: Never Smoker  . Smokeless tobacco: Never Used  Substance and Sexual Activity  . Alcohol use: No  . Drug use: No  . Sexual activity: Yes    Birth control/protection: Abstinence  Other Topics  Concern  . Not on file  Social History Narrative  . Not on file   Social Determinants of Health   Financial Resource Strain: Not on file  Food Insecurity: Not on file  Transportation Needs: Not on file  Physical Activity: Not on file  Stress: Not on file  Social Connections: Not on file  Intimate Partner Violence: Not on file    Family History: Family History  Problem Relation Age of Onset  . Breast cancer Mother 60  . Breast cancer Paternal Aunt 52  . Breast cancer Maternal Grandmother 39  . Esophageal cancer Maternal Grandmother   . Prostate cancer Maternal Grandfather 78    Review of Systems: Review of Systems  Constitutional: Positive for malaise/fatigue. Negative for chills and fever.  HENT: Negative for congestion and sore throat.   Respiratory: Negative for cough and shortness of breath.   Cardiovascular: Negative for chest pain and palpitations.  Gastrointestinal: Negative for abdominal pain, nausea and vomiting.  Skin: Negative for itching and rash.    Physical Exam: Vital Signs BP 126/79 (BP Location: Left Arm, Patient Position: Sitting, Cuff Size: Large)   Pulse 60   Temp 98.2 F (36.8 C) (Oral)   Ht 5' 4" (1.626 m)   Wt 188 lb 3.2 oz (85.4 kg)   SpO2 100%   BMI 32.30 kg/m  Physical Exam Vitals and nursing note reviewed.  Constitutional:      General: She is not in acute distress.    Appearance: Normal appearance. She is normal weight. She is not ill-appearing.  HENT:     Head: Normocephalic and atraumatic.  Eyes:     Extraocular Movements: Extraocular movements intact.  Cardiovascular:     Rate and Rhythm: Normal rate and regular rhythm.     Pulses: Normal pulses.     Heart sounds: Normal heart sounds.  Pulmonary:     Effort: Pulmonary effort is normal.     Breath sounds: Normal breath sounds. No wheezing, rhonchi or rales.  Chest:     Comments: Some capsular contracture present bilaterally. Well healed incisions present on bilateral  breasts. Abdominal:     General: Bowel sounds are normal.     Palpations: Abdomen is soft.  Musculoskeletal:        General: No swelling. Normal range of motion.     Cervical back: Normal range of motion.  Skin:    General: Skin is warm and dry.     Coloration: Skin is not pale.     Findings: No erythema or rash.  Neurological:     General: No focal deficit present.     Mental Status: She is alert and oriented to person, place, and time.  Psychiatric:        Mood and Affect: Mood normal.        Behavior: Behavior normal.        Thought Content: Thought content normal.          Judgment: Judgment normal.     Assessment/Plan:  Ms. Mccaul scheduled for bilateral removal of breast implants with capsulectomy with Dr. Dillingham.  Risks, benefits, and alternatives of procedure discussed, questions answered and consent obtained.    Smoking Status: non-smoker; Counseling Given? N/A Last Mammogram: N/A - bilateral mastectomies  Caprini Score: high; Risk Factors include: 44 yr-old female, hx cancer, varicose veins, BMI > 25, and length of planned surgery. Recommendation for mechanical and pharmacological prophylaxis during surgery. Encourage early ambulation.   Pictures obtained: 04/22/20  Post-op Rx sent to pharmacy: Norco, Zofran, Keflex  Patient was provided with the General Surgical Risk consent document and Pain Medication Agreement prior to their appointment.  They had adequate time to read through the risk consent documents and Pain Medication Agreement. We also discussed them in person together during this preop appointment. All of their questions were answered to their satisfaction.  Recommended calling if they have any further questions.  Risk consent form and Pain Medication Agreement to be scanned into patient's chart.  Electronically signed by: Addaline Peplinski C Terran Hollenkamp, PA-C 05/26/2020 2:54 PM          

## 2020-05-23 NOTE — Progress Notes (Signed)
ICD-10-CM   1. Acquired absence of both breasts  Z90.13   2. BRCA1 positive  Z15.01    Z15.09       Patient ID: Debra Lowery, female    DOB: 01-29-1976, 44 y.o.   MRN: [id number]   History of Present Illness: Debra Lowery is a 44 y.o.  female  with a history of capsular contracture and implant sickness.  She presents for preoperative evaluation for upcoming procedure, bilateral removal of breast implants and capsulectomy, scheduled for 06/05/20 with Dr. Marla Roe.  Summary from previous visit: Patient was found to be BRCA positive in 2016 and underwent bilateral mastectomies with reconstruction. She has Mentor memory shaped breast implants 375 cc. She had concerns for capsular contracture and implant sickness. She reports severe tiredness and aching. Patient reports she is uncomfortable enough that she wants the implants removed. She had ultrasound on 11/18 which was negative, no signs of rupture of implants. Drains will most likely be placed. Patient understands she may need a revision depending on how much skin tightening occurs over the next 6-12 months.   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   Past Medical History: Allergies: No Known Allergies  Current Medications:  Current Outpatient Medications:  .  Cholecalciferol 50 MCG (2000 UT) CAPS, Take 1 capsule by mouth daily., Disp: , Rfl:  .  MAGNESIUM PO, Take by mouth., Disp: , Rfl:  .  Cyanocobalamin (B-12 PO), Take by mouth., Disp: , Rfl:  .  loratadine (CLARITIN) 10 MG tablet, Take 10 mg by mouth daily., Disp: , Rfl:  .  oseltamivir (TAMIFLU) 75 MG capsule, Take 75 mg by mouth., Disp: , Rfl:   Past Medical Problems: Past Medical History:  Diagnosis Date  . Anemia   . BRCA gene positive    BRCA 1  . BRCA1 positive 04/17/2013  . Bronchitis   . Cancer [facility])    left breast cancer   . History of kidney stones   . Mass of breast, left   . Pre-eclampsia in third trimester   . Wears contact lenses     Past Surgical History: Past Surgical History:  Procedure Laterality Date  . BREAST RECONSTRUCTION Right 10/08/2015   Procedure: REPOSITIONING OF RIGHT BREAST IMPLANT;  Surgeon: Wallace Going, DO;  Location: [place];  Service: Plastics;  Laterality: Right;  . BREAST RECONSTRUCTION WITH PLACEMENT OF TISSUE EXPANDER AND FLEX HD (ACELLULAR HYDRATED DERMIS) Bilateral 07/18/2014   Procedure: IMMEDIATE BILATERAL BREAST RECONSTRUCTION WITH PLACEMENT OF TISSUE EXPANDER AND FLEX HD (ACELLULAR HYDRATED DERMIS);  Surgeon: Theodoro Kos, DO;  Location: [place];  Service: Plastics;  Laterality: Bilateral;  . COLONOSCOPY    . DILATION AND CURETTAGE OF UTERUS  2016   D&E  . KNEE ARTHROSCOPY     left  . LIPOSUCTION WITH LIPOFILLING Bilateral 10/08/2015   Procedure: BILATERAL BREAST LIPO FILLING ;  Surgeon: Wallace Going, DO;  Location: [place];  Service: Plastics;  Laterality: Bilateral;  . MASTECTOMY Bilateral   . REMOVAL OF BILATERAL TISSUE EXPANDERS WITH PLACEMENT OF BILATERAL BREAST IMPLANTS Bilateral 10/16/2014   Procedure: REMOVAL OF BILATERAL TISSUE EXPANDERS WITH PLACEMENT OF BILATERAL BREAST IMPLANTS;  Surgeon: Theodoro Kos, DO;  Location: [place];  Service: Plastics;  Laterality: Bilateral;  . TUBAL LIGATION    . tubal reversal    . wisdom teeth extraction  Social History: Social History   Socioeconomic History  . Marital status: Married    Spouse name: Not on file  . Number of children: 4  . Years of education: Not on file  . Highest education level: Not on file  Occupational History  . Not on file  Tobacco Use  . Smoking status: Never Smoker  . Smokeless tobacco: Never Used  Substance and Sexual Activity  . Alcohol use: No  . Drug use: No  . Sexual activity: Yes    Birth control/protection: Abstinence  Other Topics  Concern  . Not on file  Social History Narrative  . Not on file   Social Determinants of Health   Financial Resource Strain: Not on file  Food Insecurity: Not on file  Transportation Needs: Not on file  Physical Activity: Not on file  Stress: Not on file  Social Connections: Not on file  Intimate Partner Violence: Not on file    Family History: Family History  Problem Relation Age of Onset  . Breast cancer Mother 19  . Breast cancer Paternal Aunt 78  . Breast cancer Maternal Grandmother 41  . Esophageal cancer Maternal Grandmother   . Prostate cancer Maternal Grandfather 78    Review of Systems: Review of Systems  Constitutional: Positive for malaise/fatigue. Negative for chills and fever.  HENT: Negative for congestion and sore throat.   Respiratory: Negative for cough and shortness of breath.   Cardiovascular: Negative for chest pain and palpitations.  Gastrointestinal: Negative for abdominal pain, nausea and vomiting.  Skin: Negative for itching and rash.    Physical Exam: Vital Signs BP 126/79 (BP Location: Left Arm, Patient Position: Sitting, Cuff Size: Large)   Pulse 60   Temp 98.2 F (36.8 C) (Oral)   Ht 5' 4" (1.626 m)   Wt 188 lb 3.2 oz (85.4 kg)   SpO2 100%   BMI 32.30 kg/m  Physical Exam Vitals and nursing note reviewed.  Constitutional:      General: She is not in acute distress.    Appearance: Normal appearance. She is normal weight. She is not ill-appearing.  HENT:     Head: Normocephalic and atraumatic.  Eyes:     Extraocular Movements: Extraocular movements intact.  Cardiovascular:     Rate and Rhythm: Normal rate and regular rhythm.     Pulses: Normal pulses.     Heart sounds: Normal heart sounds.  Pulmonary:     Effort: Pulmonary effort is normal.     Breath sounds: Normal breath sounds. No wheezing, rhonchi or rales.  Chest:     Comments: Some capsular contracture present bilaterally. Well healed incisions present on bilateral  breasts. Abdominal:     General: Bowel sounds are normal.     Palpations: Abdomen is soft.  Musculoskeletal:        General: No swelling. Normal range of motion.     Cervical back: Normal range of motion.  Skin:    General: Skin is warm and dry.     Coloration: Skin is not pale.     Findings: No erythema or rash.  Neurological:     General: No focal deficit present.     Mental Status: She is alert and oriented to person, place, and time.  Psychiatric:        Mood and Affect: Mood normal.        Behavior: Behavior normal.        Thought Content: Thought content normal.  Judgment: Judgment normal.     Assessment/Plan:  Ms. Bourassa scheduled for bilateral removal of breast implants with capsulectomy with Dr. Dillingham.  Risks, benefits, and alternatives of procedure discussed, questions answered and consent obtained.    Smoking Status: non-smoker; Counseling Given? N/A Last Mammogram: N/A - bilateral mastectomies  Caprini Score: high; Risk Factors include: 44 yr-old female, hx cancer, varicose veins, BMI > 25, and length of planned surgery. Recommendation for mechanical and pharmacological prophylaxis during surgery. Encourage early ambulation.   Pictures obtained: 04/22/20  Post-op Rx sent to pharmacy: Norco, Zofran, Keflex  Patient was provided with the General Surgical Risk consent document and Pain Medication Agreement prior to their appointment.  They had adequate time to read through the risk consent documents and Pain Medication Agreement. We also discussed them in person together during this preop appointment. All of their questions were answered to their satisfaction.  Recommended calling if they have any further questions.  Risk consent form and Pain Medication Agreement to be scanned into patient's chart.  Electronically signed by: Johanna C Young, PA-C 05/26/2020 2:54 PM          

## 2020-05-26 ENCOUNTER — Ambulatory Visit (INDEPENDENT_AMBULATORY_CARE_PROVIDER_SITE_OTHER): Payer: BC Managed Care – PPO | Admitting: Plastic Surgery

## 2020-05-26 ENCOUNTER — Other Ambulatory Visit: Payer: Self-pay

## 2020-05-26 ENCOUNTER — Encounter: Payer: Self-pay | Admitting: Plastic Surgery

## 2020-05-26 VITALS — BP 126/79 | HR 60 | Temp 98.2°F | Ht 64.0 in | Wt 188.2 lb

## 2020-05-26 DIAGNOSIS — Z1509 Genetic susceptibility to other malignant neoplasm: Secondary | ICD-10-CM

## 2020-05-26 DIAGNOSIS — Z1501 Genetic susceptibility to malignant neoplasm of breast: Secondary | ICD-10-CM

## 2020-05-26 DIAGNOSIS — Z9013 Acquired absence of bilateral breasts and nipples: Secondary | ICD-10-CM

## 2020-05-26 MED ORDER — HYDROCODONE-ACETAMINOPHEN 5-325 MG PO TABS: 1.0000 | ORAL_TABLET | Freq: Three times a day (TID) | ORAL | 0 refills | Status: AC | PRN

## 2020-05-26 MED ORDER — ONDANSETRON HCL 4 MG PO TABS: 4.0000 mg | ORAL_TABLET | Freq: Three times a day (TID) | ORAL | 0 refills | Status: DC | PRN

## 2020-05-26 MED ORDER — CEPHALEXIN 500 MG PO CAPS: 500.0000 mg | ORAL_CAPSULE | Freq: Four times a day (QID) | ORAL | 0 refills | Status: AC

## 2020-05-30 ENCOUNTER — Other Ambulatory Visit: Payer: Self-pay

## 2020-05-30 ENCOUNTER — Encounter (HOSPITAL_BASED_OUTPATIENT_CLINIC_OR_DEPARTMENT_OTHER): Payer: Self-pay | Admitting: Plastic Surgery

## 2020-06-02 ENCOUNTER — Other Ambulatory Visit (HOSPITAL_COMMUNITY)
Admission: RE | Admit: 2020-06-02 | Discharge: 2020-06-02 | Disposition: A | Discharge status: Home / Self Care | Payer: BC Managed Care – PPO | Source: Ambulatory Visit | Attending: Plastic Surgery | Admitting: Plastic Surgery

## 2020-06-02 ENCOUNTER — Encounter: Payer: Self-pay | Admitting: Plastic Surgery

## 2020-06-02 DIAGNOSIS — Z01812 Encounter for preprocedural laboratory examination: Secondary | ICD-10-CM | POA: Insufficient documentation

## 2020-06-02 DIAGNOSIS — Z20822 Contact with and (suspected) exposure to covid-19: Secondary | ICD-10-CM | POA: Insufficient documentation

## 2020-06-02 DIAGNOSIS — Z79899 Other long term (current) drug therapy: Secondary | ICD-10-CM | POA: Diagnosis not present

## 2020-06-02 DIAGNOSIS — Z9013 Acquired absence of bilateral breasts and nipples: Secondary | ICD-10-CM | POA: Diagnosis not present

## 2020-06-02 DIAGNOSIS — Y831 Surgical operation with implant of artificial internal device as the cause of abnormal reaction of the patient, or of later complication, without mention of misadventure at the time of the procedure: Secondary | ICD-10-CM | POA: Diagnosis not present

## 2020-06-02 DIAGNOSIS — Z1501 Genetic susceptibility to malignant neoplasm of breast: Secondary | ICD-10-CM | POA: Diagnosis not present

## 2020-06-02 DIAGNOSIS — T8544XA Capsular contracture of breast implant, initial encounter: Secondary | ICD-10-CM | POA: Diagnosis not present

## 2020-06-02 DIAGNOSIS — Z853 Personal history of malignant neoplasm of breast: Secondary | ICD-10-CM | POA: Diagnosis not present

## 2020-06-02 LAB — SARS CORONAVIRUS 2 (TAT 6-24 HRS): SARS Coronavirus 2: NEGATIVE

## 2020-06-05 ENCOUNTER — Other Ambulatory Visit: Payer: Self-pay

## 2020-06-05 ENCOUNTER — Encounter (HOSPITAL_BASED_OUTPATIENT_CLINIC_OR_DEPARTMENT_OTHER): Payer: Self-pay | Admitting: Plastic Surgery

## 2020-06-05 ENCOUNTER — Ambulatory Visit (HOSPITAL_BASED_OUTPATIENT_CLINIC_OR_DEPARTMENT_OTHER): Payer: BC Managed Care – PPO | Admitting: Anesthesiology

## 2020-06-05 ENCOUNTER — Encounter (HOSPITAL_BASED_OUTPATIENT_CLINIC_OR_DEPARTMENT_OTHER)
Admission: RE | Disposition: A | Discharge status: Home / Self Care | Payer: Self-pay | Source: Home / Self Care | Attending: Plastic Surgery

## 2020-06-05 ENCOUNTER — Ambulatory Visit (HOSPITAL_BASED_OUTPATIENT_CLINIC_OR_DEPARTMENT_OTHER)
Admission: RE | Admit: 2020-06-05 | Discharge: 2020-06-05 | Disposition: A | Discharge status: Home / Self Care | Payer: BC Managed Care – PPO | Attending: Plastic Surgery | Admitting: Plastic Surgery

## 2020-06-05 DIAGNOSIS — Z79899 Other long term (current) drug therapy: Secondary | ICD-10-CM | POA: Insufficient documentation

## 2020-06-05 DIAGNOSIS — Z20822 Contact with and (suspected) exposure to covid-19: Secondary | ICD-10-CM | POA: Insufficient documentation

## 2020-06-05 DIAGNOSIS — Y831 Surgical operation with implant of artificial internal device as the cause of abnormal reaction of the patient, or of later complication, without mention of misadventure at the time of the procedure: Secondary | ICD-10-CM | POA: Insufficient documentation

## 2020-06-05 DIAGNOSIS — Z1501 Genetic susceptibility to malignant neoplasm of breast: Secondary | ICD-10-CM | POA: Insufficient documentation

## 2020-06-05 DIAGNOSIS — Z853 Personal history of malignant neoplasm of breast: Secondary | ICD-10-CM | POA: Diagnosis not present

## 2020-06-05 DIAGNOSIS — T8544XA Capsular contracture of breast implant, initial encounter: Secondary | ICD-10-CM | POA: Diagnosis not present

## 2020-06-05 DIAGNOSIS — Z9013 Acquired absence of bilateral breasts and nipples: Secondary | ICD-10-CM | POA: Insufficient documentation

## 2020-06-05 DIAGNOSIS — Z45819 Encounter for adjustment or removal of unspecified breast implant: Secondary | ICD-10-CM | POA: Diagnosis not present

## 2020-06-05 HISTORY — PX: BREAST IMPLANT REMOVAL: SHX5361

## 2020-06-05 HISTORY — PX: CAPSULECTOMY: SHX5381

## 2020-06-05 SURGERY — REMOVAL, IMPLANT, BREAST
Anesthesia: General | Site: Breast | Laterality: Bilateral

## 2020-06-05 MED ORDER — OXYCODONE HCL 5 MG/5ML PO SOLN
5.0000 mg | Freq: Once | ORAL | Status: DC | PRN
Start: 2020-06-05 — End: 2020-06-05

## 2020-06-05 MED ORDER — OXYCODONE HCL 5 MG PO TABS: 5.0000 mg | ORAL_TABLET | Freq: Once | ORAL | Status: DC | PRN

## 2020-06-05 MED ORDER — SODIUM CHLORIDE 0.9 % IV SOLN: 250.0000 mL | INTRAVENOUS | Status: DC | PRN

## 2020-06-05 MED ORDER — ACETAMINOPHEN 325 MG PO TABS: 650.0000 mg | ORAL_TABLET | ORAL | Status: DC | PRN

## 2020-06-05 MED ORDER — SCOPOLAMINE 1 MG/3DAYS TD PT72
1.0000 | MEDICATED_PATCH | Freq: Once | TRANSDERMAL | Status: DC
  Administered 2020-06-05: 10:00:00 1.5 mg via TRANSDERMAL

## 2020-06-05 MED ORDER — FENTANYL CITRATE (PF) 100 MCG/2ML IJ SOLN
INTRAMUSCULAR | Status: AC
  Filled 2020-06-05: qty 2

## 2020-06-05 MED ORDER — SODIUM CHLORIDE 0.9% FLUSH: 3.0000 mL | INTRAVENOUS | Status: DC | PRN

## 2020-06-05 MED ORDER — DIPHENHYDRAMINE HCL 50 MG/ML IJ SOLN
INTRAMUSCULAR | Status: DC | PRN
  Administered 2020-06-05: 6.25 mg via INTRAVENOUS

## 2020-06-05 MED ORDER — LIDOCAINE HCL (CARDIAC) PF 100 MG/5ML IV SOSY
PREFILLED_SYRINGE | INTRAVENOUS | Status: DC | PRN
  Administered 2020-06-05: 80 mg via INTRAVENOUS

## 2020-06-05 MED ORDER — OXYCODONE HCL 5 MG PO TABS
5.0000 mg | ORAL_TABLET | ORAL | Status: DC | PRN
Start: 2020-06-05 — End: 2020-06-05

## 2020-06-05 MED ORDER — CHLORHEXIDINE GLUCONATE CLOTH 2 % EX PADS: 6.0000 | MEDICATED_PAD | Freq: Once | CUTANEOUS | Status: DC

## 2020-06-05 MED ORDER — FENTANYL CITRATE (PF) 100 MCG/2ML IJ SOLN
INTRAMUSCULAR | Status: DC | PRN
  Administered 2020-06-05 (×2): 50 ug via INTRAVENOUS

## 2020-06-05 MED ORDER — DIPHENHYDRAMINE HCL 50 MG/ML IJ SOLN
INTRAMUSCULAR | Status: AC
  Filled 2020-06-05: qty 1

## 2020-06-05 MED ORDER — CEFAZOLIN SODIUM-DEXTROSE 2-4 GM/100ML-% IV SOLN
2.0000 g | INTRAVENOUS | Status: AC
  Administered 2020-06-05: 10:00:00 2 g via INTRAVENOUS

## 2020-06-05 MED ORDER — ROCURONIUM BROMIDE 10 MG/ML (PF) SYRINGE
PREFILLED_SYRINGE | INTRAVENOUS | Status: AC
  Filled 2020-06-05: qty 10

## 2020-06-05 MED ORDER — LACTATED RINGERS IV SOLN: INTRAVENOUS | Status: DC

## 2020-06-05 MED ORDER — EPHEDRINE 5 MG/ML INJ
INTRAVENOUS | Status: AC
  Filled 2020-06-05: qty 10

## 2020-06-05 MED ORDER — DEXAMETHASONE SODIUM PHOSPHATE 4 MG/ML IJ SOLN
INTRAMUSCULAR | Status: DC | PRN
  Administered 2020-06-05: 5 mg via INTRAVENOUS

## 2020-06-05 MED ORDER — LIDOCAINE 2% (20 MG/ML) 5 ML SYRINGE
INTRAMUSCULAR | Status: AC
  Filled 2020-06-05: qty 5

## 2020-06-05 MED ORDER — SODIUM CHLORIDE 0.9% FLUSH: 3.0000 mL | Freq: Two times a day (BID) | INTRAVENOUS | Status: DC

## 2020-06-05 MED ORDER — PROMETHAZINE HCL 25 MG/ML IJ SOLN
INTRAMUSCULAR | Status: AC
  Filled 2020-06-05: qty 1

## 2020-06-05 MED ORDER — SCOPOLAMINE 1 MG/3DAYS TD PT72
MEDICATED_PATCH | TRANSDERMAL | Status: AC
  Filled 2020-06-05: qty 1

## 2020-06-05 MED ORDER — IBUPROFEN 200 MG PO TABS
ORAL_TABLET | ORAL | Status: AC
  Filled 2020-06-05: qty 3

## 2020-06-05 MED ORDER — PHENYLEPHRINE 40 MCG/ML (10ML) SYRINGE FOR IV PUSH (FOR BLOOD PRESSURE SUPPORT)
PREFILLED_SYRINGE | INTRAVENOUS | Status: AC
  Filled 2020-06-05: qty 10

## 2020-06-05 MED ORDER — CEFAZOLIN SODIUM-DEXTROSE 2-4 GM/100ML-% IV SOLN
INTRAVENOUS | Status: AC
  Filled 2020-06-05: qty 100

## 2020-06-05 MED ORDER — LIDOCAINE-EPINEPHRINE 1 %-1:100000 IJ SOLN
INTRAMUSCULAR | Status: DC | PRN
  Administered 2020-06-05: 13 mL

## 2020-06-05 MED ORDER — 0.9 % SODIUM CHLORIDE (POUR BTL) OPTIME
TOPICAL | Status: DC | PRN
  Administered 2020-06-05: 10:00:00 1000 mL

## 2020-06-05 MED ORDER — DEXAMETHASONE SODIUM PHOSPHATE 10 MG/ML IJ SOLN
INTRAMUSCULAR | Status: AC
  Filled 2020-06-05: qty 1

## 2020-06-05 MED ORDER — SUCCINYLCHOLINE CHLORIDE 200 MG/10ML IV SOSY
PREFILLED_SYRINGE | INTRAVENOUS | Status: AC
  Filled 2020-06-05: qty 10

## 2020-06-05 MED ORDER — ACETAMINOPHEN 325 MG RE SUPP: 650.0000 mg | RECTAL | Status: DC | PRN

## 2020-06-05 MED ORDER — SUGAMMADEX SODIUM 200 MG/2ML IV SOLN
INTRAVENOUS | Status: DC | PRN
  Administered 2020-06-05: 200 mg via INTRAVENOUS

## 2020-06-05 MED ORDER — MIDAZOLAM HCL 5 MG/5ML IJ SOLN
INTRAMUSCULAR | Status: DC | PRN
  Administered 2020-06-05: 2 mg via INTRAVENOUS

## 2020-06-05 MED ORDER — ACETAMINOPHEN 500 MG PO TABS
1000.0000 mg | ORAL_TABLET | Freq: Once | ORAL | Status: AC
  Administered 2020-06-05: 10:00:00 1000 mg via ORAL

## 2020-06-05 MED ORDER — FENTANYL CITRATE (PF) 100 MCG/2ML IJ SOLN
25.0000 ug | INTRAMUSCULAR | Status: DC | PRN
  Administered 2020-06-05 (×2): 25 ug via INTRAVENOUS

## 2020-06-05 MED ORDER — MIDAZOLAM HCL 2 MG/2ML IJ SOLN
INTRAMUSCULAR | Status: AC
  Filled 2020-06-05: qty 2

## 2020-06-05 MED ORDER — ONDANSETRON HCL 4 MG/2ML IJ SOLN
INTRAMUSCULAR | Status: AC
  Filled 2020-06-05: qty 2

## 2020-06-05 MED ORDER — PROMETHAZINE HCL 25 MG/ML IJ SOLN
6.2500 mg | INTRAMUSCULAR | Status: DC | PRN
  Administered 2020-06-05: 12:00:00 12.5 mg via INTRAVENOUS

## 2020-06-05 MED ORDER — FENTANYL CITRATE (PF) 100 MCG/2ML IJ SOLN: 25.0000 ug | INTRAMUSCULAR | Status: DC | PRN

## 2020-06-05 MED ORDER — PROPOFOL 10 MG/ML IV BOLUS
INTRAVENOUS | Status: DC | PRN
  Administered 2020-06-05: 160 mg via INTRAVENOUS

## 2020-06-05 MED ORDER — IBUPROFEN 600 MG PO TABS
600.0000 mg | ORAL_TABLET | Freq: Once | ORAL | Status: AC
  Administered 2020-06-05: 13:00:00 600 mg via ORAL

## 2020-06-05 MED ORDER — ROCURONIUM BROMIDE 100 MG/10ML IV SOLN
INTRAVENOUS | Status: DC | PRN
  Administered 2020-06-05: 60 mg via INTRAVENOUS

## 2020-06-05 MED ORDER — ACETAMINOPHEN 500 MG PO TABS
ORAL_TABLET | ORAL | Status: AC
  Filled 2020-06-05: qty 2

## 2020-06-05 SURGICAL SUPPLY — 75 items
ADH SKN CLS APL DERMABOND .7 (GAUZE/BANDAGES/DRESSINGS)
BAG DECANTER FOR FLEXI CONT (MISCELLANEOUS) ×3 IMPLANT
BINDER BREAST LRG (GAUZE/BANDAGES/DRESSINGS) ×2 IMPLANT
BINDER BREAST MEDIUM (GAUZE/BANDAGES/DRESSINGS) IMPLANT
BINDER BREAST XLRG (GAUZE/BANDAGES/DRESSINGS) IMPLANT
BINDER BREAST XXLRG (GAUZE/BANDAGES/DRESSINGS) IMPLANT
BIOPATCH RED 1 DISK 7.0 (GAUZE/BANDAGES/DRESSINGS) ×2 IMPLANT
BIOPATCH RED 1IN DISK 7.0MM (GAUZE/BANDAGES/DRESSINGS) ×2
BLADE HEX COATED 2.75 (ELECTRODE) ×3 IMPLANT
BLADE SURG 15 STRL LF DISP TIS (BLADE) ×2 IMPLANT
BLADE SURG 15 STRL SS (BLADE) ×6
BNDG GAUZE ELAST 4 BULKY (GAUZE/BANDAGES/DRESSINGS) ×6 IMPLANT
CANISTER SUCT 1200ML W/VALVE (MISCELLANEOUS) ×3 IMPLANT
CLOSURE WOUND 1/2 X4 (GAUZE/BANDAGES/DRESSINGS)
COVER BACK TABLE 60X90IN (DRAPES) ×3 IMPLANT
COVER MAYO STAND STRL (DRAPES) ×3 IMPLANT
COVER WAND RF STERILE (DRAPES) IMPLANT
DECANTER SPIKE VIAL GLASS SM (MISCELLANEOUS) IMPLANT
DERMABOND ADVANCED (GAUZE/BANDAGES/DRESSINGS)
DERMABOND ADVANCED .7 DNX12 (GAUZE/BANDAGES/DRESSINGS) IMPLANT
DRAIN CHANNEL 15F RND FF W/TCR (WOUND CARE) ×4 IMPLANT
DRAIN CHANNEL 19F RND (DRAIN) IMPLANT
DRAPE LAPAROSCOPIC ABDOMINAL (DRAPES) ×3 IMPLANT
DRSG OPSITE POSTOP 4X6 (GAUZE/BANDAGES/DRESSINGS) ×4 IMPLANT
DRSG PAD ABDOMINAL 8X10 ST (GAUZE/BANDAGES/DRESSINGS) ×6 IMPLANT
ELECT BLADE 4.0 EZ CLEAN MEGAD (MISCELLANEOUS)
ELECT BLADE 6.5 EXT (BLADE) IMPLANT
ELECT REM PT RETURN 9FT ADLT (ELECTROSURGICAL) ×3
ELECTRODE BLDE 4.0 EZ CLN MEGD (MISCELLANEOUS) IMPLANT
ELECTRODE REM PT RTRN 9FT ADLT (ELECTROSURGICAL) ×1 IMPLANT
EVACUATOR SILICONE 100CC (DRAIN) ×4 IMPLANT
GLOVE BIO SURGEON STRL SZ 6.5 (GLOVE) ×4 IMPLANT
GLOVE BIO SURGEONS STRL SZ 6.5 (GLOVE) ×2
GOWN STRL REUS W/ TWL LRG LVL3 (GOWN DISPOSABLE) ×2 IMPLANT
GOWN STRL REUS W/TWL LRG LVL3 (GOWN DISPOSABLE) ×6
IV NS 1000ML (IV SOLUTION)
IV NS 1000ML BAXH (IV SOLUTION) IMPLANT
IV NS 500ML (IV SOLUTION)
IV NS 500ML BAXH (IV SOLUTION) IMPLANT
KIT FILL SYSTEM UNIVERSAL (SET/KITS/TRAYS/PACK) IMPLANT
NDL HYPO 25X1 1.5 SAFETY (NEEDLE) ×1 IMPLANT
NDL SAFETY ECLIPSE 18X1.5 (NEEDLE) IMPLANT
NEEDLE HYPO 18GX1.5 SHARP (NEEDLE)
NEEDLE HYPO 25X1 1.5 SAFETY (NEEDLE) ×3 IMPLANT
NS IRRIG 1000ML POUR BTL (IV SOLUTION) ×3 IMPLANT
PACK BASIN DAY SURGERY FS (CUSTOM PROCEDURE TRAY) ×3 IMPLANT
PENCIL SMOKE EVACUATOR (MISCELLANEOUS) ×3 IMPLANT
PIN SAFETY STERILE (MISCELLANEOUS) IMPLANT
SLEEVE SCD COMPRESS KNEE MED (MISCELLANEOUS) ×3 IMPLANT
SPONGE LAP 18X18 RF (DISPOSABLE) ×6 IMPLANT
STAPLER VISISTAT 35W (STAPLE) IMPLANT
STRIP CLOSURE SKIN 1/2X4 (GAUZE/BANDAGES/DRESSINGS) IMPLANT
STRIP SUTURE WOUND CLOSURE 1/2 (MISCELLANEOUS) IMPLANT
SUT MNCRL AB 4-0 PS2 18 (SUTURE) ×1 IMPLANT
SUT MON AB 3-0 SH 27 (SUTURE) ×3
SUT MON AB 3-0 SH27 (SUTURE) ×1 IMPLANT
SUT MON AB 5-0 PS2 18 (SUTURE) ×6 IMPLANT
SUT PDS AB 2-0 CT2 27 (SUTURE) IMPLANT
SUT PROLENE 3 0 PS 2 (SUTURE) IMPLANT
SUT SILK 3 0 PS 1 (SUTURE) IMPLANT
SUT SILK 4 0 PS 2 (SUTURE) ×4 IMPLANT
SUT VIC AB 3-0 SH 27 (SUTURE)
SUT VIC AB 3-0 SH 27X BRD (SUTURE) IMPLANT
SUT VICRYL 4-0 PS2 18IN ABS (SUTURE) ×2 IMPLANT
SWAB COLLECTION DEVICE MRSA (MISCELLANEOUS) IMPLANT
SWAB CULTURE ESWAB REG 1ML (MISCELLANEOUS) IMPLANT
SYR 50ML LL SCALE MARK (SYRINGE) IMPLANT
SYR BULB IRRIG 60ML STRL (SYRINGE) ×3 IMPLANT
SYR CONTROL 10ML LL (SYRINGE) ×3 IMPLANT
TOWEL GREEN STERILE FF (TOWEL DISPOSABLE) ×6 IMPLANT
TRAY DSU PREP LF (CUSTOM PROCEDURE TRAY) ×3 IMPLANT
TUBE CONNECTING 20'X1/4 (TUBING) ×1
TUBE CONNECTING 20X1/4 (TUBING) ×2 IMPLANT
UNDERPAD 30X36 HEAVY ABSORB (UNDERPADS AND DIAPERS) ×6 IMPLANT
YANKAUER SUCT BULB TIP NO VENT (SUCTIONS) ×3 IMPLANT

## 2020-06-05 NOTE — Anesthesia Preprocedure Evaluation (Addendum)
Anesthesia Evaluation  Patient identified by MRN, date of birth, ID band Patient awake    Reviewed: Allergy & Precautions, NPO status , Patient's Chart, lab work & pertinent test results  History of Anesthesia Complications Negative for: history of anesthetic complications  Airway Mallampati: II  TM Distance: >3 FB Neck ROM: Full    Dental no notable dental hx.    Pulmonary neg pulmonary ROS,    Pulmonary exam normal        Cardiovascular negative cardio ROS Normal cardiovascular exam     Neuro/Psych negative neurological ROS  negative psych ROS   GI/Hepatic negative GI ROS, Neg liver ROS,   Endo/Other  negative endocrine ROS  Renal/GU negative Renal ROS  negative genitourinary   Musculoskeletal negative musculoskeletal ROS (+)   Abdominal   Peds  Hematology negative hematology ROS (+)   Anesthesia Other Findings Hx of breast ca  Reproductive/Obstetrics negative OB ROS                            Anesthesia Physical Anesthesia Plan  ASA: II  Anesthesia Plan: General   Post-op Pain Management:    Induction: Intravenous  PONV Risk Score and Plan: 3 and Treatment may vary due to age or medical condition, Ondansetron, Dexamethasone and Midazolam  Airway Management Planned: Oral ETT  Additional Equipment: None  Intra-op Plan:   Post-operative Plan: Extubation in OR  Informed Consent: I have reviewed the patients History and Physical, chart, labs and discussed the procedure including the risks, benefits and alternatives for the proposed anesthesia with the patient or authorized representative who has indicated his/her understanding and acceptance.     Dental advisory given  Plan Discussed with: CRNA  Anesthesia Plan Comments:        Anesthesia Quick Evaluation

## 2020-06-05 NOTE — Op Note (Signed)
Op report Bilateral Exchange   DATE OF OPERATION: 06/05/2020  LOCATION: [place]  SURGICAL DIVISION: Plastic Surgery  PREOPERATIVE DIAGNOSIS:  1. History of breast cancer.  2. Acquired absence of bilateral breast.   POSTOPERATIVE DIAGNOSIS:  1. History of breast cancer.  2. Acquired absence of bilateral breast.   PROCEDURE:  1. Bilateral removal of breast implants.  2. Bilateral capsulectomies.  SURGEON: Catalyna Reilly Sanger Hadia Minier, DO  ASSISTANT: Roetta Sessions, PA  ANESTHESIA:  General.   COMPLICATIONS: None.   INDICATIONS FOR PROCEDURE:  The patient, Debra Lowery, is a 44 y.o. female born on 1975/08/24, is here for treatment after bilateral mastectomies. She had implant based reconstruction with textured anatomic silicone implants. Over the past year she has noticed vague symptoms of discomfort that have concerned her about having the implants in place.  She decided to have them removed.  We discussed this several times and she states she is sure about her decision. MRN: [id number]  CONSENT:  Informed consent was obtained directly from the patient. Risks, benefits and alternatives were fully discussed. Specific risks including but not limited to bleeding, infection, hematoma, seroma, scarring, pain, infection, capsular contracture, asymmetry, wound healing problems, and need for further surgery were all discussed. The patient did have an ample opportunity to have her questions answered to her satisfaction.   DESCRIPTION OF PROCEDURE:  The patient was taken to the operating room. SCDs were placed and IV antibiotics were given. The patient's chest was prepped and draped in a sterile fashion. A time out was performed and the implants to be used were identified.    On the right breast: One percent Lidocaine with epinephrine was used to infiltrate at the incision site. The old mastectomy scar was incised at the inframammary fold.  The dissection was  carried down to the capsule.  Once the capsule was located a combination of tissue scissors and the Bovie were used to free the capsule from the surrounding soft tissue.  It was then cut from the pectoralis major muscle border.  The capsule was released and excised for most of the inferior pectoralis muscle.  Where we got into heavy bleeding eyes stopped so as not to disrupt the muscle fibers.  There were a couple of places on the chest wall that were thin and would have exposed ribs and that was left in place.  The pocket was irrigated with saline.  Hemostasis was achieved with electrocautery.  A drain was placed and secured to the chest wall with a 4-0 silk.  The implant was removed and sent with the capsule for pathology.  There were no concerning areas noted in the pocket.  New gloves were placed to close the incision.  3-0 Monocryl was used for the deep layers. The remaining layers and skin were closed with 4-0 Monocryl deep dermal and 5-0 Monocryl subcuticular stitches.   On the left breast: One percent Lidocaine with epinephrine was used to infiltrate at the incision site. The old mastectomy scar was incised at the inframammary fold.  The dissection was carried down to the capsule.  Once the capsule was located a combination of tissue scissors and the Bovie were used to free the capsule from the surrounding soft tissue.  It was then cut from the pectoralis major muscle border.  The capsule was released and excised for most of the inferior pectoralis muscle.  Where we got into heavy bleeding eyes stopped so as not to disrupt the muscle fibers.  There were  a couple of places on the chest wall that were thin and would have exposed ribs and that was left in place.  The pocket was irrigated with saline.  Hemostasis was achieved with electrocautery.  A drain was placed and secured to the chest wall with a 4-0 silk.  The implant was removed and sent with the capsule for pathology.  There were no concerning areas  noted in the pocket.  New gloves were placed to close the incision.  3-0 Monocryl was used for the deep layers. The remaining layers and skin were closed with 4-0 Monocryl deep dermal and 5-0 Monocryl subcuticular stitches. Dermabond was applied to the incision site. A breast binder and ABDs were placed.  The patient was allowed to wake from anesthesia and taken to the recovery room in satisfactory condition.   The advanced practice practitioner (APP) assisted throughout the case.  The APP was essential in retraction and counter traction when needed to make the case progress smoothly.  This retraction and assistance made it possible to see the tissue plans for the procedure.  The assistance was needed for blood control, tissue re-approximation and assisted with closure of the incision site.

## 2020-06-05 NOTE — Interval H&P Note (Signed)
History and Physical Interval Note:  06/05/2020 9:19 AM  Debra Lowery  has presented today for surgery, with the diagnosis of history of breast cancer.  The various methods of treatment have been discussed with the patient and family. After consideration of risks, benefits and other options for treatment, the patient has consented to  Procedure(s) with comments: REMOVAL BREAST IMPLANTS (Bilateral) - 1.5 hours, please CAPSULECTOMY (Bilateral) as a surgical intervention.  The patient's history has been reviewed, patient examined, no change in status, stable for surgery.  I have reviewed the patient's chart and labs.  Questions were answered to the patient's satisfaction.     Loel Lofty Komal Stangelo

## 2020-06-05 NOTE — Anesthesia Postprocedure Evaluation (Signed)
Anesthesia Post Note  Patient: Debra Lowery  Procedure(s) Performed: REMOVAL OF BILATERAL BREAST IMPLANTS (Bilateral Breast) BILATERAL BREAST CAPSULECTOMIES (Bilateral Breast)     Patient location during evaluation: PACU Anesthesia Type: General Level of consciousness: awake and alert and oriented Pain management: pain level controlled Vital Signs Assessment: post-procedure vital signs reviewed and stable Respiratory status: spontaneous breathing, nonlabored ventilation and respiratory function stable Cardiovascular status: blood pressure returned to baseline Postop Assessment: no apparent nausea or vomiting Anesthetic complications: no   No complications documented.  Last Vitals:  Vitals:   06/05/20 1215 06/05/20 1250  BP: 124/69 (!) 127/59  Pulse: 67 85  Resp: 16 16  Temp:  36.8 C  SpO2: 98% 97%    Last Pain:  Vitals:   06/05/20 1230  TempSrc:   PainSc: [place]

## 2020-06-05 NOTE — Discharge Instructions (Signed)

## 2020-06-05 NOTE — Anesthesia Procedure Notes (Signed)

## 2020-06-05 NOTE — Transfer of Care (Signed)
Immediate Anesthesia Transfer of Care Note  Patient: Debra Lowery  Procedure(s) Performed: REMOVAL OF BILATERAL BREAST IMPLANTS (Bilateral Breast) BILATERAL BREAST CAPSULECTOMIES (Bilateral Breast)  Patient Location: PACU  Anesthesia Type:General  Level of Consciousness: sedated  Airway & Oxygen Therapy: Patient Spontanous Breathing and Patient connected to face mask oxygen  Post-op Assessment: Report given to RN and Post -op Vital signs reviewed and stable  Post vital signs: Reviewed and stable  Last Vitals:  Vitals Value Taken Time  BP    Temp    Pulse    Resp    SpO2      Last Pain:  Vitals:   06/05/20 0937  TempSrc: Oral  PainSc: 1       Patients Stated Pain Goal: 1 (30/94/07 6808)  Complications: No complications documented.

## 2020-06-09 ENCOUNTER — Encounter (HOSPITAL_BASED_OUTPATIENT_CLINIC_OR_DEPARTMENT_OTHER): Payer: Self-pay | Admitting: Plastic Surgery

## 2020-06-09 DIAGNOSIS — Z9886 Personal history of breast implant removal: Secondary | ICD-10-CM | POA: Insufficient documentation

## 2020-06-09 LAB — SURGICAL PATHOLOGY

## 2020-06-09 NOTE — Progress Notes (Signed)
   Subjective:     Patient ID: Debra Lowery, female    DOB: 1976-03-04, 44 y.o.   MRN: [id number]  Chief Complaint  Patient presents with  . Post-op Follow-up    HPI: The patient is a 44 y.o. female here for follow-up after undergoing bilateral removal of breast implants on 06/05/20 with Dr. Ulice Bold.   ~ 1 week PO Patient reports she is feeling great!  Denies fever/chills, nausea/vomiting, pain.  Reports drains were uncomfortable.  Reports no concerns.  Review of Systems  Constitutional: Negative for chills and fever.  Respiratory: Negative for shortness of breath.   Cardiovascular: Negative for chest pain and leg swelling.  Gastrointestinal: Negative for constipation, diarrhea, nausea and vomiting.  Skin: Negative for color change, pallor, rash and wound.     Objective:   Vital Signs BP 123/71 (BP Location: Left Arm, Patient Position: Sitting, Cuff Size: Normal)   Pulse 65   Temp 98.5 F (36.9 C) (Oral)   LMP 09/14/2015   SpO2 100%  Vital Signs and Nursing Note Reviewed  Physical Exam Constitutional:      General: She is not in acute distress.    Appearance: Normal appearance. She is normal weight. She is not ill-appearing.  HENT:     Head: Normocephalic and atraumatic.  Eyes:     Extraocular Movements: Extraocular movements intact and EOM normal.  Cardiovascular:     Rate and Rhythm: Normal rate.  Pulmonary:     Effort: Pulmonary effort is normal.  Chest:     Comments: Bilateral incisions are intact, clean and dry.  Steri-Strips in place.  No signs of infection, redness, drainage, seroma/hematoma.  Right drain output greater than 45 cc for the last several days.  Left drain output less than 30 cc for the last several days.  Left drain was removed today.  Musculoskeletal:        General: Normal range of motion.     Cervical back: Normal range of motion.  Skin:    General: Skin is warm and dry.     Coloration: Skin is not pale.     Findings: No  erythema or rash.  Neurological:     Mental Status: She is alert and oriented to person, place, and time.     Gait: Gait is intact.  Psychiatric:        Mood and Affect: Mood and affect normal.        Behavior: Behavior normal.        Thought Content: Thought content normal.        Cognition and Memory: Memory normal.        Judgment: Judgment normal.       Assessment/Plan:     ICD-10-CM   1. History of bilateral removal of breast implants  Z98.86   2. Acquired absence of both breasts  Z90.13    Ms. Dudzik is doing very well today.  Reports she is feeling great!  Bilateral incisions are intact, clean and dry.  With no signs of infection, redness, drainage, seroma/hematoma.  Honeycomb dressings were removed today.  Steri-Strips are in place. Left drain removed today. Cover drain site with Vaseline and gauze for the next few days until closed.  Follow up next week for possible drain removal. Return precautions given. Call office with any questions/concerns.    Eldridge Abrahams, PA-C 06/12/2020, 1:43 PM

## 2020-06-10 ENCOUNTER — Encounter: Payer: Self-pay | Admitting: Plastic Surgery

## 2020-06-10 ENCOUNTER — Telehealth: Payer: Self-pay

## 2020-06-10 NOTE — Telephone Encounter (Signed)
Patient called to say that she thinks her drains started leaking this morning.  It soaked through her binder.  She cleaned it up and it seems to be ok now.  Please call.

## 2020-06-10 NOTE — Telephone Encounter (Signed)
Returned patients call. Advised her to change dressings/gauze as needed and use Vaseline at the drain site. We will see her at her PO on 06/12/20. Patient understood and agreed.

## 2020-06-12 ENCOUNTER — Encounter: Payer: Self-pay | Admitting: Plastic Surgery

## 2020-06-12 ENCOUNTER — Other Ambulatory Visit: Payer: Self-pay

## 2020-06-12 ENCOUNTER — Ambulatory Visit (INDEPENDENT_AMBULATORY_CARE_PROVIDER_SITE_OTHER): Payer: BC Managed Care – PPO | Admitting: Plastic Surgery

## 2020-06-12 VITALS — BP 123/71 | HR 65 | Temp 98.5°F

## 2020-06-12 DIAGNOSIS — Z9013 Acquired absence of bilateral breasts and nipples: Secondary | ICD-10-CM

## 2020-06-12 DIAGNOSIS — Z9886 Personal history of breast implant removal: Secondary | ICD-10-CM

## 2020-06-13 ENCOUNTER — Encounter: Payer: BC Managed Care – PPO | Admitting: Plastic Surgery

## 2020-06-15 NOTE — Progress Notes (Signed)
Patient is a 45 year old female here for follow-up after undergoing bilateral removal of breast implants on 06/05/2020 with Dr. Ulice Bold.  At last visit on 12/30 right drain output was greater than 45 cc for the last several days and was left in place.  ~1.5 weeks PO Patient reports she is doing very well. Denies fever/chills, nausea/vomiting, pain of either breast. Reports the drainage has stopped from her right drain. Reports it has been less than 20 cc for the last few days. Bilateral incisions are healing very nicely, C/D/I. No signs of infection, redness, drainage, seroma/hematoma. Right drain was removed today. Cover drain site with Vaseline and gauze for the next few days until closed. Next follow-up appointment currently scheduled for January 18. Return precautions provided. Call office with any questions/concerns.

## 2020-06-16 ENCOUNTER — Other Ambulatory Visit: Payer: Self-pay

## 2020-06-16 ENCOUNTER — Encounter: Payer: Self-pay | Admitting: Plastic Surgery

## 2020-06-16 ENCOUNTER — Ambulatory Visit (INDEPENDENT_AMBULATORY_CARE_PROVIDER_SITE_OTHER): Payer: BC Managed Care – PPO | Admitting: Plastic Surgery

## 2020-06-16 VITALS — BP 127/70 | HR 69 | Temp 98.2°F

## 2020-06-16 DIAGNOSIS — Z9013 Acquired absence of bilateral breasts and nipples: Secondary | ICD-10-CM

## 2020-06-16 DIAGNOSIS — Z9886 Personal history of breast implant removal: Secondary | ICD-10-CM

## 2020-07-01 ENCOUNTER — Other Ambulatory Visit: Payer: Self-pay

## 2020-07-01 ENCOUNTER — Encounter: Payer: Self-pay | Admitting: Plastic Surgery

## 2020-07-01 ENCOUNTER — Ambulatory Visit (INDEPENDENT_AMBULATORY_CARE_PROVIDER_SITE_OTHER): Payer: BC Managed Care – PPO | Admitting: Plastic Surgery

## 2020-07-01 VITALS — BP 128/85 | HR 56

## 2020-07-01 DIAGNOSIS — Z9013 Acquired absence of bilateral breasts and nipples: Secondary | ICD-10-CM

## 2020-07-02 ENCOUNTER — Telehealth: Payer: Self-pay

## 2020-07-02 NOTE — Telephone Encounter (Signed)
Faxed referral to [facility] °

## 2020-07-04 ENCOUNTER — Encounter: Payer: Self-pay | Admitting: Plastic Surgery

## 2020-07-04 NOTE — Progress Notes (Signed)
The patient is a 45 year old female here for follow-up on her breast surgery.  She underwent removal of her breast implants.  She is healing nicely.  There is no sign of seroma or hematoma.  The incisions are intact.  There is no redness.  She states she feels much better.  She also showed me pictures of the decrease in swelling in her face from pre and postop.  She should continue with her breast binder or sports bra for another couple of weeks to help prevent a seroma buildup.  I would like to see her back in several weeks to make sure everything is healing nicely.  Pictures were obtained of the patient and placed in the chart with the patient's or guardian's permission.

## 2020-11-14 ENCOUNTER — Ambulatory Visit
Admission: RE | Admit: 2020-11-14 | Discharge: 2020-11-14 | Disposition: A | Discharge status: Home / Self Care | Payer: BC Managed Care – PPO | Source: Ambulatory Visit | Attending: Chiropractic Medicine | Admitting: Chiropractic Medicine

## 2020-11-14 ENCOUNTER — Other Ambulatory Visit: Payer: Self-pay

## 2020-11-14 ENCOUNTER — Other Ambulatory Visit: Payer: Self-pay | Admitting: Chiropractic Medicine

## 2020-11-14 DIAGNOSIS — M549 Dorsalgia, unspecified: Secondary | ICD-10-CM

## 2020-11-14 DIAGNOSIS — M542 Cervicalgia: Secondary | ICD-10-CM

## 2021-01-13 ENCOUNTER — Ambulatory Visit: Payer: BC Managed Care – PPO | Admitting: Plastic Surgery

## 2021-01-20 ENCOUNTER — Other Ambulatory Visit: Payer: Self-pay

## 2021-01-20 ENCOUNTER — Encounter: Payer: Self-pay | Admitting: Plastic Surgery

## 2021-01-20 ENCOUNTER — Ambulatory Visit (INDEPENDENT_AMBULATORY_CARE_PROVIDER_SITE_OTHER): Payer: BC Managed Care – PPO | Admitting: Plastic Surgery

## 2021-01-20 DIAGNOSIS — Z9886 Personal history of breast implant removal: Secondary | ICD-10-CM | POA: Diagnosis not present

## 2021-01-20 NOTE — Progress Notes (Signed)
   Subjective:    Patient ID: Debra Lowery, female    DOB: 08-23-1975, 45 y.o.   MRN: [id number]  Patient is a 45 year old female here for follow-up on her breasts.  The patient had bilateral mastectomies with reconstruction in 2017.  She noticed over the years that she did not feel right and decided ultimately to have the implants removed.  Therefore the implants were removed and December 2021.  The patient states that she is very pleased with her decision from the standpoint of how she feels overall.  She feels much better and is not having the the same difficult symptoms that were generalized aches and pains prior to removal of the implants.  She does not like the way that her breasts look but she still happy she had them removed.  No lumps or bumps are noted.  She does not need to go for routine mammograms.  She is going to try to lose some weight.  She seems very happy and overall looks really good.   Review of Systems  Constitutional:  Negative for activity change and appetite change.  HENT: Negative.    Eyes: Negative.   Respiratory: Negative.  Negative for chest tightness and shortness of breath.   Cardiovascular: Negative.  Negative for leg swelling.  Gastrointestinal: Negative.  Negative for abdominal distention.  Endocrine: Negative.   Genitourinary: Negative.   Musculoskeletal: Negative.   Skin: Negative.   Hematological: Negative.   Psychiatric/Behavioral: Negative.        Objective:   Physical Exam Vitals and nursing note reviewed.  Constitutional:      Appearance: Normal appearance.  HENT:     Head: Normocephalic and atraumatic.  Cardiovascular:     Rate and Rhythm: Normal rate.     Pulses: Normal pulses.  Pulmonary:     Effort: Pulmonary effort is normal. No respiratory distress.     Breath sounds: No wheezing.  Abdominal:     General: Abdomen is flat. There is no distension.     Tenderness: There is no abdominal tenderness.  Skin:    General: Skin  is warm.     Coloration: Skin is not jaundiced.     Findings: No bruising.  Neurological:     General: No focal deficit present.     Mental Status: She is alert and oriented to person, place, and time.  Psychiatric:        Mood and Affect: Mood normal.        Behavior: Behavior normal.        Thought Content: Thought content normal.      Assessment & Plan:     ICD-10-CM   1. History of bilateral removal of breast implants  Z98.86       I remain available if the patient needs anything.  Otherwise follow-up as needed.  Make sure she is having yearly exam either by her oncologist or GYN.  I remain available if she needs anything at all.

## 2022-03-04 ENCOUNTER — Other Ambulatory Visit: Payer: Self-pay | Admitting: Family Medicine

## 2022-03-04 DIAGNOSIS — N644 Mastodynia: Secondary | ICD-10-CM

## 2022-03-18 ENCOUNTER — Other Ambulatory Visit: Payer: BC Managed Care – PPO

## 2022-03-19 ENCOUNTER — Other Ambulatory Visit: Payer: BC Managed Care – PPO

## 2022-04-01 ENCOUNTER — Ambulatory Visit
Admission: RE | Admit: 2022-04-01 | Discharge: 2022-04-01 | Disposition: A | Discharge status: Home / Self Care | Payer: BC Managed Care – PPO | Source: Ambulatory Visit | Attending: Family Medicine | Admitting: Family Medicine

## 2022-04-01 DIAGNOSIS — N644 Mastodynia: Secondary | ICD-10-CM

## 2022-08-08 IMAGING — US US BREAST*L* COMPLETE INC AXILLA
1 series · 6 of 6 positions shown · non-contrast
Comparison: None.

CLINICAL DATA: BRCA positive with bilateral mastectomies and
silicone implants in place. Planned surgical removal of the
implants. Patient states that the ordering physician requests
bilateral breast ultrasound to exclude malignancy prior to removal
of the implants.

EXAM:
ULTRASOUND OF THE BILATERAL BREAST

[Series 1: us breast*left* complete inc axilla · 0.07mm/px · 6 of 6 slices shown]
[im 1/6]
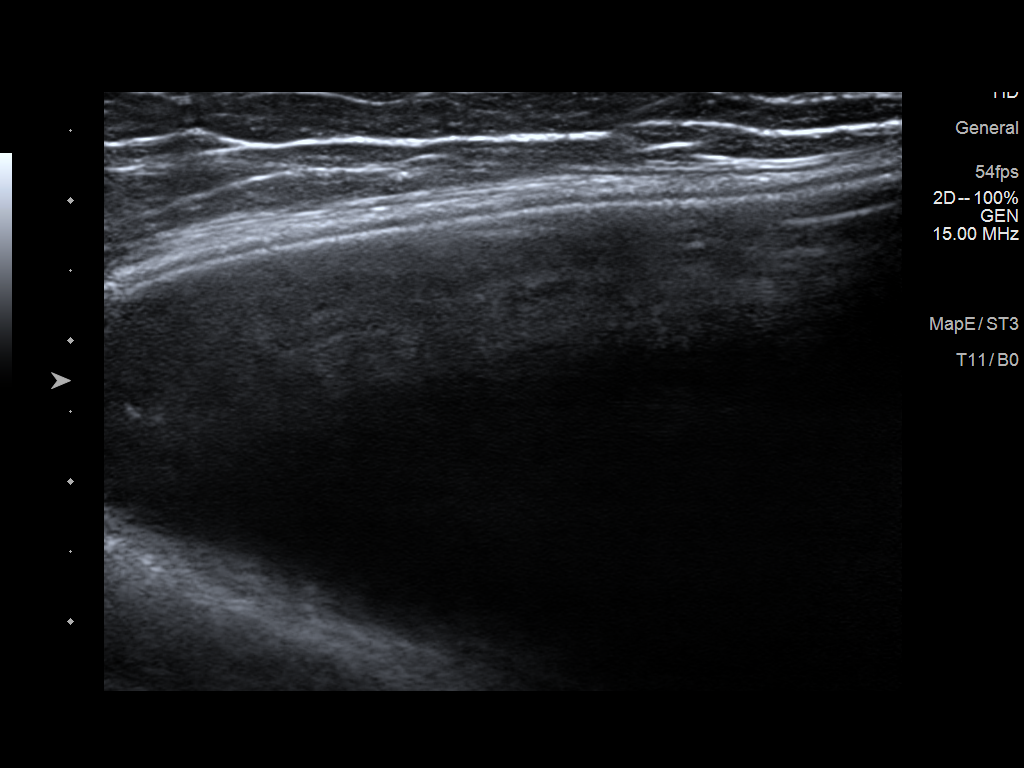
[im 2/6]
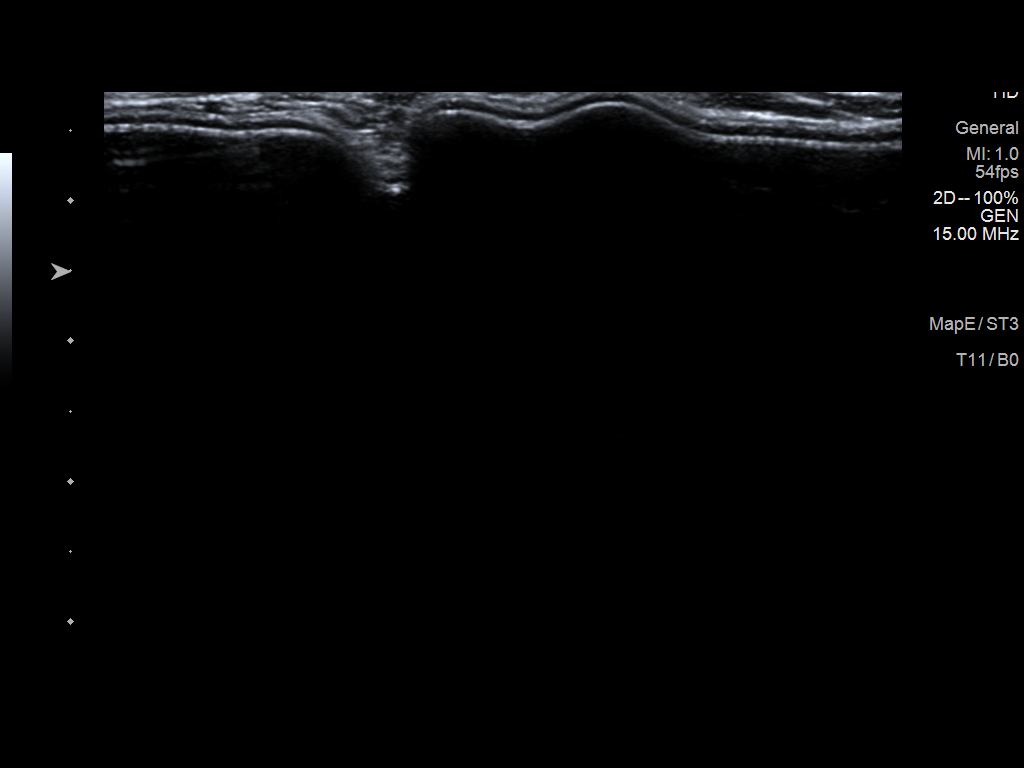
[im 3/6]
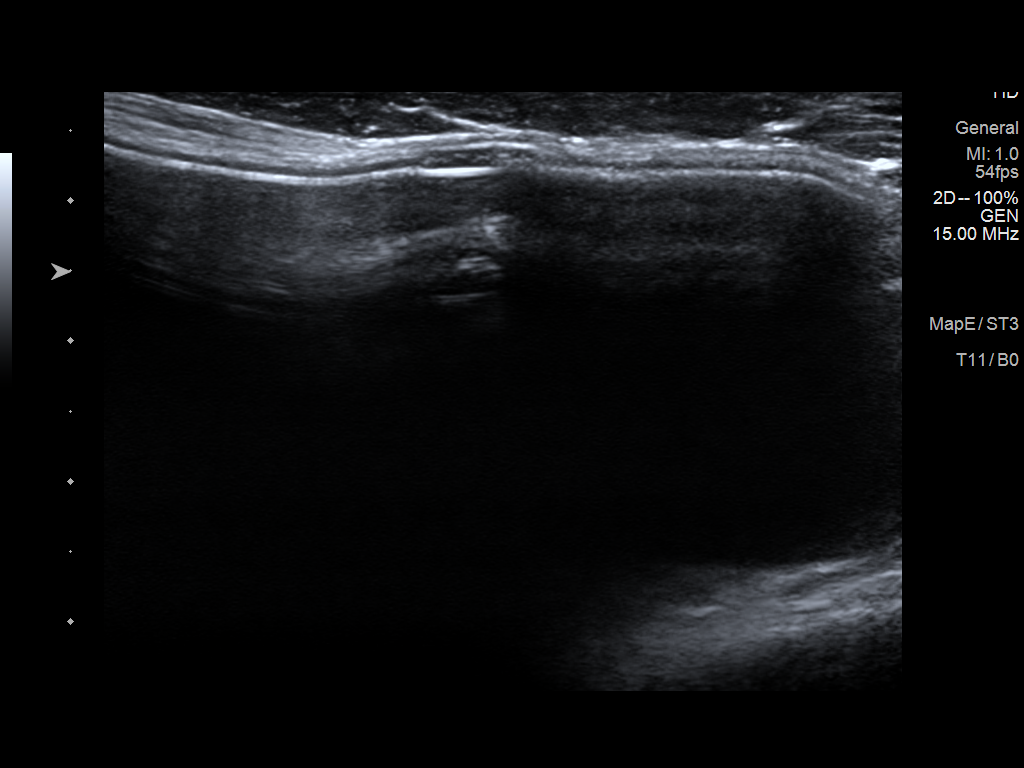
[im 4/6]
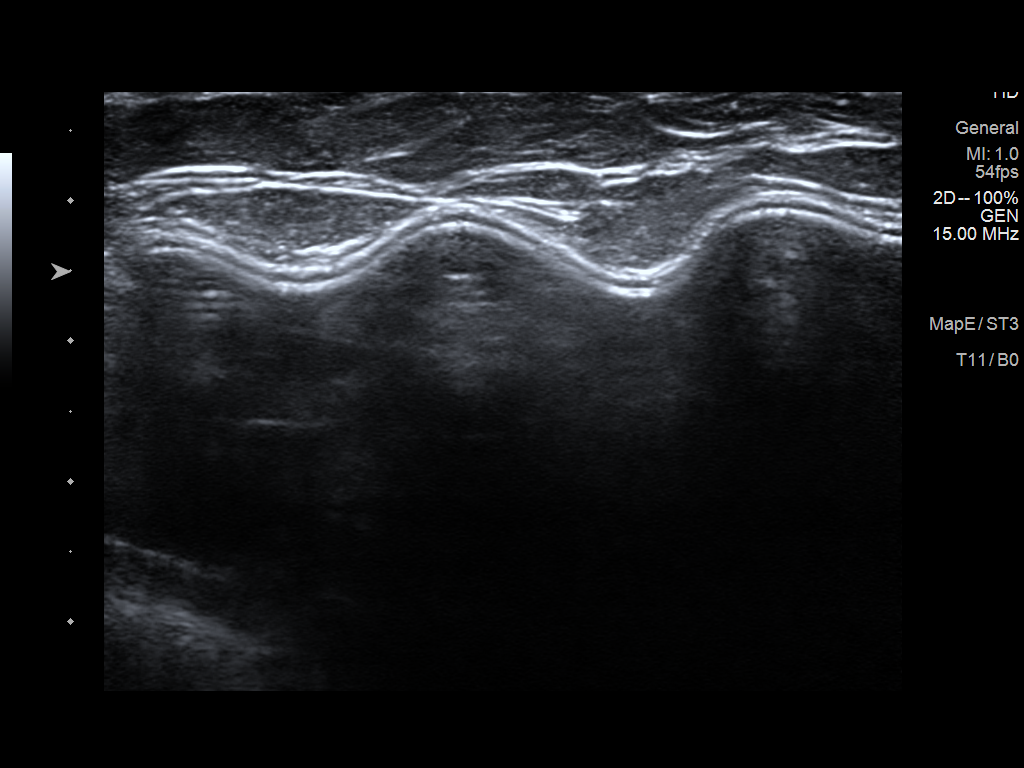
[im 5/6]
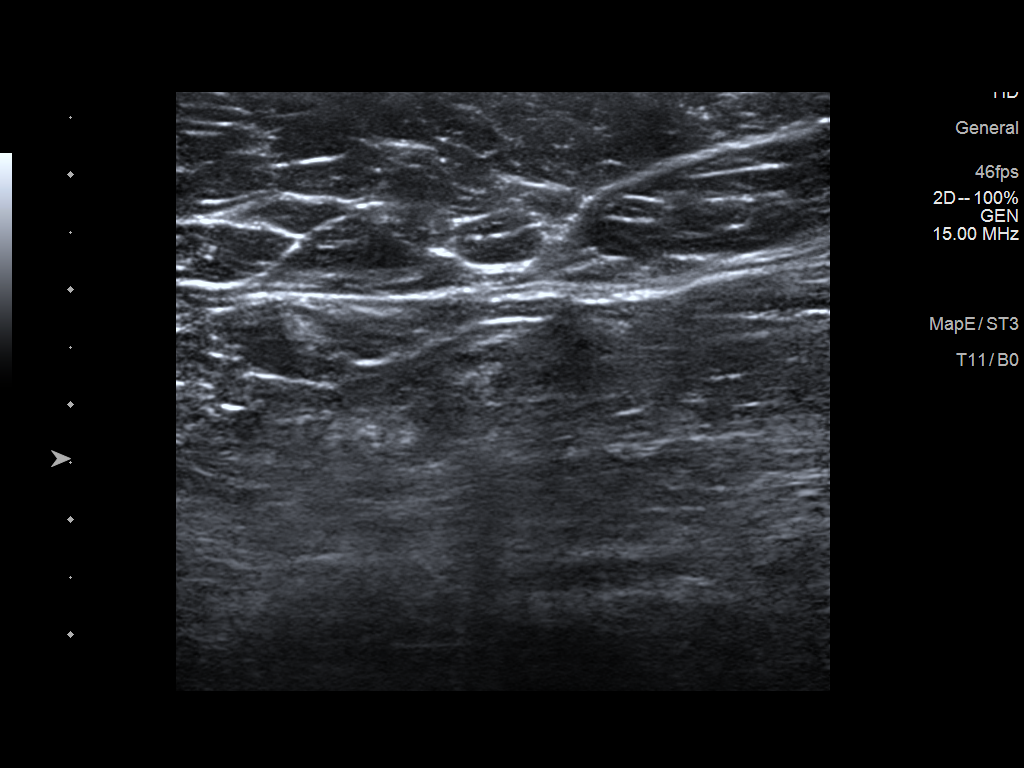
[im 6/6]
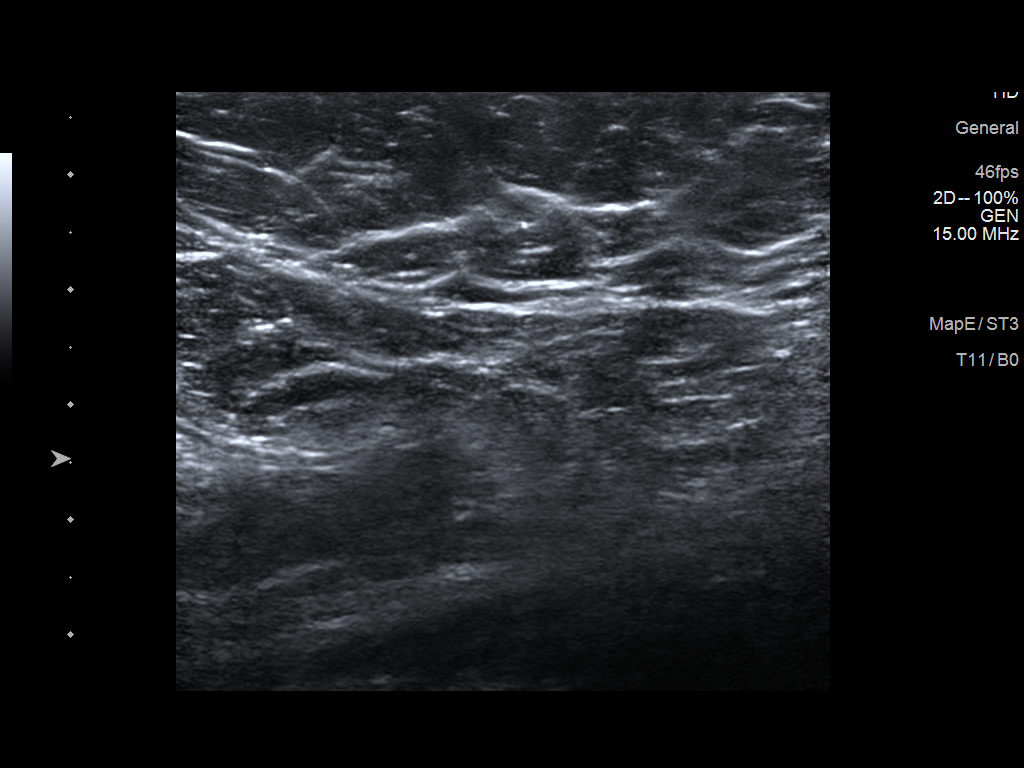

[6 of 6 positions shown; findings below may reference images not displayed]

FINDINGS: Bilateral screening breast ultrasound is performed, with images
provided for the 12 o'clock, 3 o'clock, 6 o'clock, 9 o'clock and
axillary regions bilaterally, showing only normal soft tissues
throughout. No solid or cystic mass. No evidence of free silicone.
IMPRESSION: Normal bilateral screening ultrasound. No evidence of malignancy or
acute findings. No evidence of free silicone.

RECOMMENDATION:
Clinical follow-up as needed.

I have discussed the findings and recommendations with the patient.
If applicable, a reminder letter will be sent to the patient
regarding the next appointment.

BI-RADS CATEGORY  1: Negative.

## 2022-08-08 IMAGING — US US BREAST*R* COMPLETE INC AXILLA
1 series · 6 of 6 positions shown · non-contrast
Comparison: None.

CLINICAL DATA: BRCA positive with bilateral mastectomies and
silicone implants in place. Planned surgical removal of the
implants. Patient states that the ordering physician requests
bilateral breast ultrasound to exclude malignancy prior to removal
of the implants.

EXAM:
ULTRASOUND OF THE BILATERAL BREAST

[Series 1: us breast*right* complete inc axilla · 0.07mm/px · 6 of 6 slices shown]
[im 1/6]
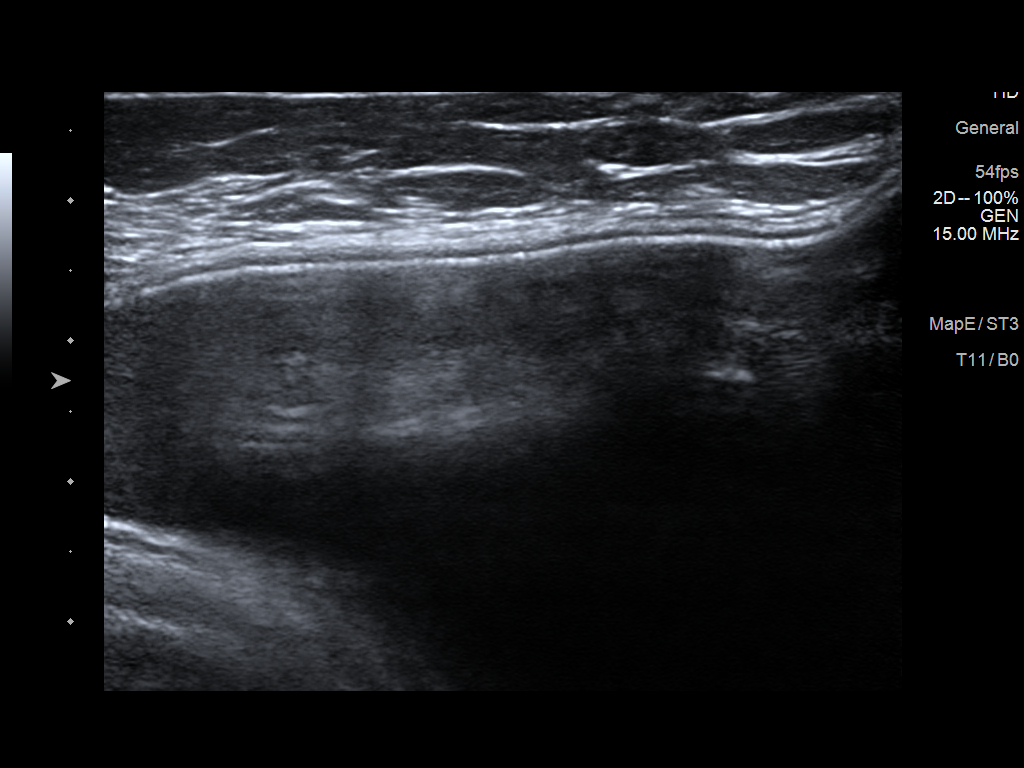
[im 2/6]
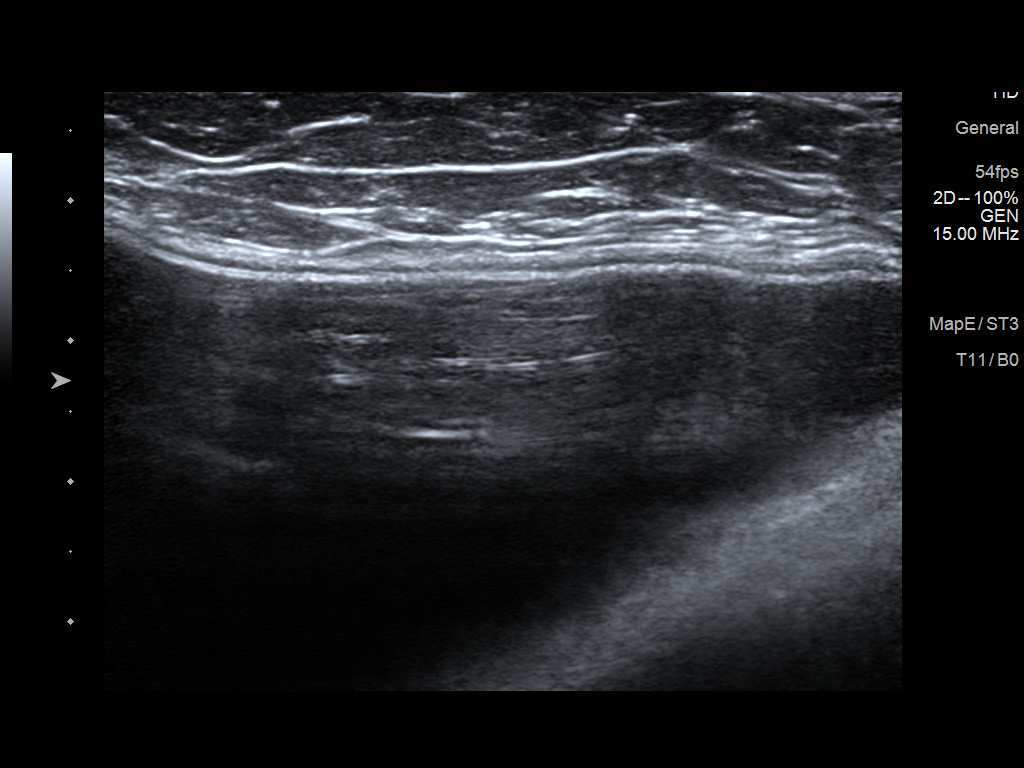
[im 3/6]
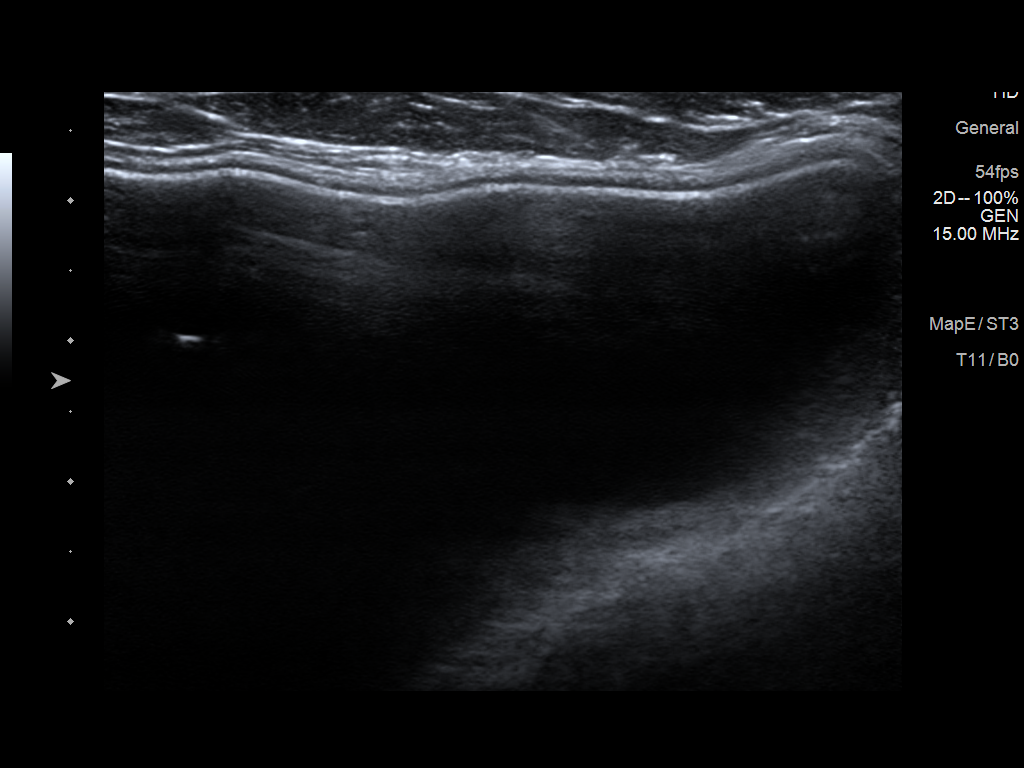
[im 4/6]
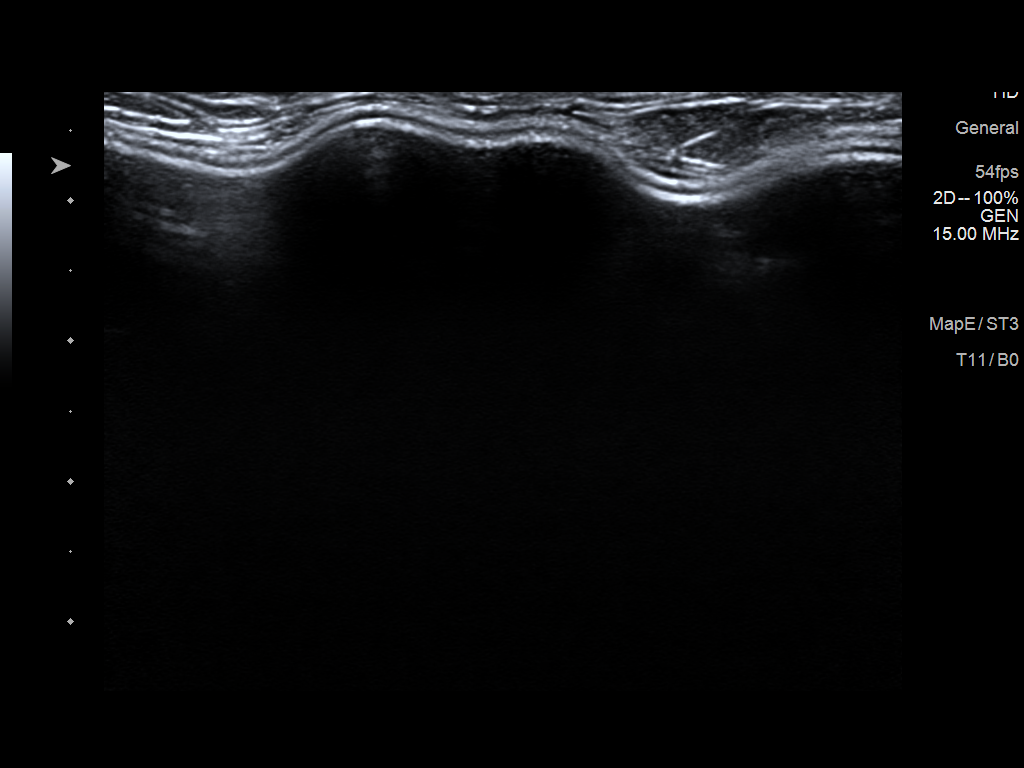
[im 5/6]
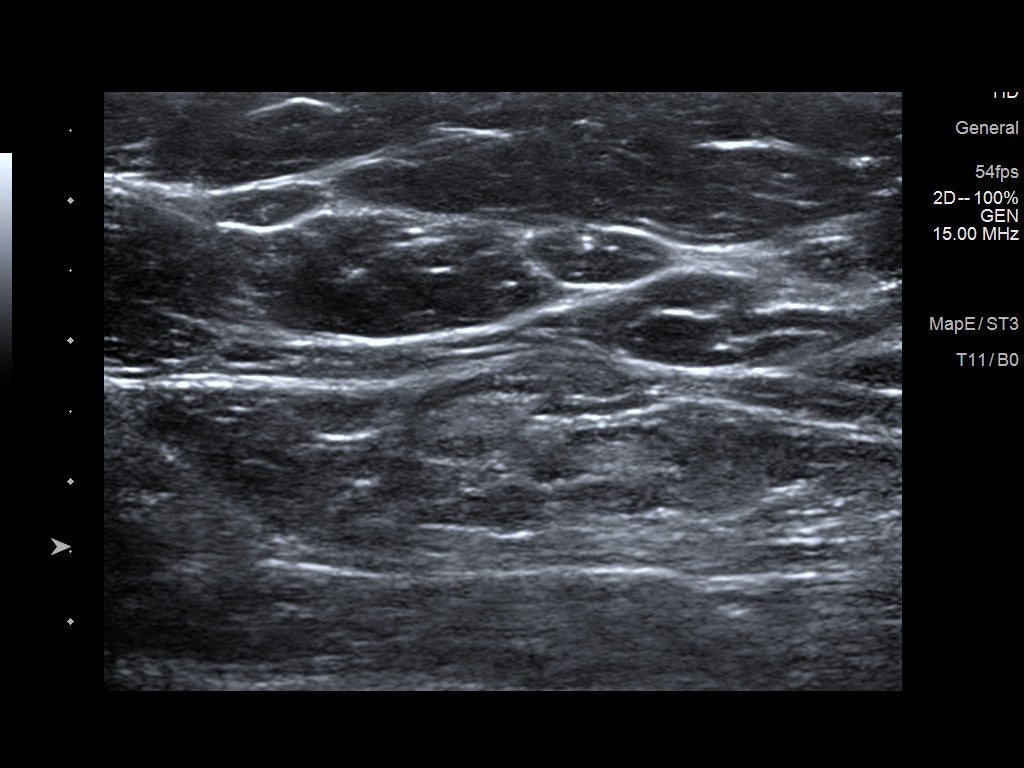
[im 6/6]
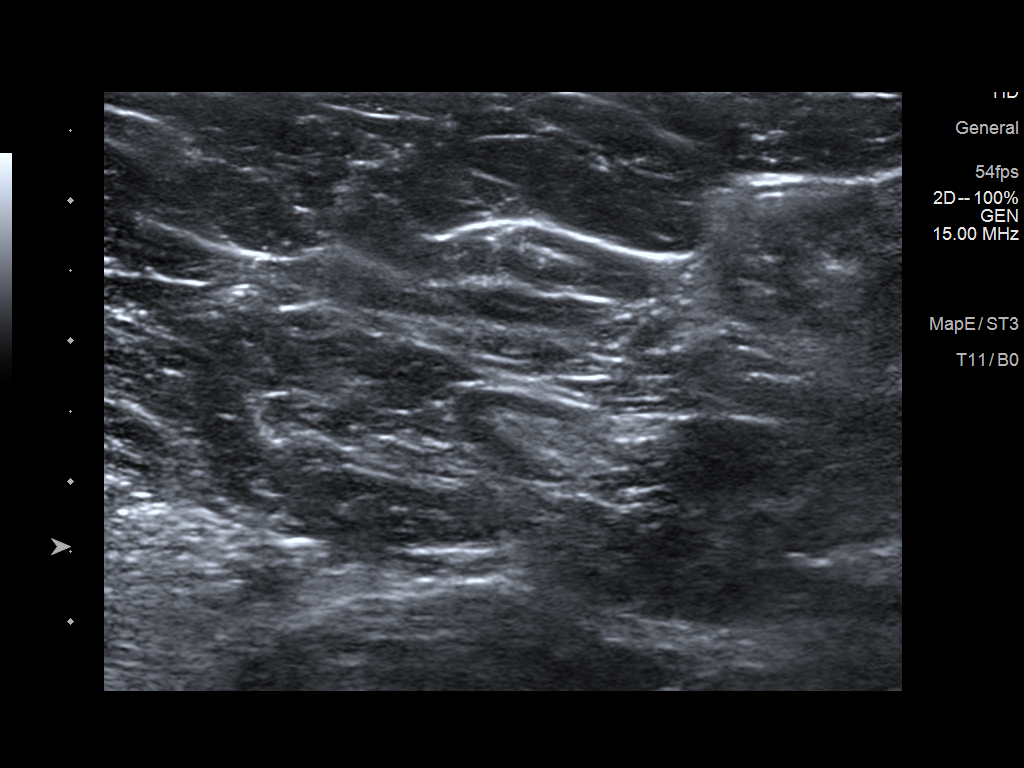

[6 of 6 positions shown; findings below may reference images not displayed]

FINDINGS: Bilateral screening breast ultrasound is performed, with images
provided for the 12 o'clock, 3 o'clock, 6 o'clock, 9 o'clock and
axillary regions bilaterally, showing only normal soft tissues
throughout. No solid or cystic mass. No evidence of free silicone.
IMPRESSION: Normal bilateral screening ultrasound. No evidence of malignancy or
acute findings. No evidence of free silicone.

RECOMMENDATION:
Clinical follow-up as needed.

I have discussed the findings and recommendations with the patient.
If applicable, a reminder letter will be sent to the patient
regarding the next appointment.

BI-RADS CATEGORY  1: Negative.

## 2023-04-16 ENCOUNTER — Other Ambulatory Visit (HOSPITAL_BASED_OUTPATIENT_CLINIC_OR_DEPARTMENT_OTHER): Payer: Self-pay

## 2023-04-17 ENCOUNTER — Other Ambulatory Visit (HOSPITAL_BASED_OUTPATIENT_CLINIC_OR_DEPARTMENT_OTHER): Payer: Self-pay

## 2023-04-17 MED ORDER — WEGOVY 0.25 MG/0.5ML ~~LOC~~ SOAJ
0.2500 mg | SUBCUTANEOUS | 1 refills | Status: DC
  Filled 2023-04-17: qty 2, 28d supply, fill #0

## 2023-04-18 ENCOUNTER — Other Ambulatory Visit: Payer: Self-pay

## 2023-04-18 ENCOUNTER — Other Ambulatory Visit (HOSPITAL_BASED_OUTPATIENT_CLINIC_OR_DEPARTMENT_OTHER): Payer: Self-pay

## 2023-04-29 ENCOUNTER — Other Ambulatory Visit (HOSPITAL_BASED_OUTPATIENT_CLINIC_OR_DEPARTMENT_OTHER): Payer: Self-pay

## 2023-09-14 ENCOUNTER — Other Ambulatory Visit: Payer: Self-pay

## 2023-09-14 ENCOUNTER — Ambulatory Visit (AMBULATORY_SURGERY_CENTER): Payer: Self-pay

## 2023-09-14 VITALS — Ht 65.0 in | Wt 200.0 lb

## 2023-09-14 DIAGNOSIS — Z1211 Encounter for screening for malignant neoplasm of colon: Secondary | ICD-10-CM

## 2023-09-14 MED ORDER — SUFLAVE 178.7 G PO SOLR: 1.0000 | ORAL | 0 refills | Status: DC

## 2023-09-14 NOTE — Progress Notes (Signed)
 Denies allergies to eggs or soy products. Denies complication of anesthesia or sedation. Denies use of weight loss medication. Denies use of O2.   Emmi instructions given for colonoscopy.

## 2023-09-22 ENCOUNTER — Encounter: Payer: Self-pay | Admitting: Pediatrics

## 2023-09-26 NOTE — Progress Notes (Unsigned)
 [facility] History and Physical   Primary Care Physician:  Richmond Campbell., PA-C   Reason for Procedure:  Colorectal cancer screening  Plan:    Screening colonoscopy   HPI: Debra Lowery is a 48 y.o. female undergoing screening colonoscopy for colorectal cancer screening.  Patient previously had a colonoscopy in her early 42s when she had GI symptoms worrisome for Crohn's disease but this diagnosis was dismissed-had a viral syndrome instead.  No family history of colorectal cancer or polyps.  Patient denies current symptoms of change in bowel habits or rectal bleeding.   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   Bronchitis    Cancer (HCC)    left breast cancer   Depression    GERD (gastroesophageal reflux disease)    History of kidney stones    Hyperlipidemia    Mass of breast, left    Pre-eclampsia in third trimester    Wears contact lenses     Past Surgical History:  Procedure Laterality Date   BREAST IMPLANT REMOVAL Bilateral 06/05/2020   Procedure: REMOVAL OF BILATERAL BREAST IMPLANTS;  Surgeon: Peggye Form, DO;  Location: [facility];  Service: Plastics;  Laterality: Bilateral;   BREAST RECONSTRUCTION Right 10/08/2015   Procedure: REPOSITIONING OF RIGHT BREAST IMPLANT;  Surgeon: Peggye Form, DO;  Location: [facility];  Service: Plastics;  Laterality: Right;   BREAST RECONSTRUCTION WITH PLACEMENT OF TISSUE EXPANDER AND FLEX HD (ACELLULAR HYDRATED DERMIS) Bilateral 07/18/2014   Procedure: IMMEDIATE BILATERAL BREAST RECONSTRUCTION WITH PLACEMENT OF TISSUE EXPANDER AND FLEX HD (ACELLULAR HYDRATED DERMIS);  Surgeon: Wayland Denis, DO;  Location: [facility] OR;  Service: Plastics;  Laterality: Bilateral;   CAPSULECTOMY Bilateral 06/05/2020   Procedure: BILATERAL BREAST CAPSULECTOMIES;  Surgeon: Peggye Form, DO;  Location: [facility];  Service: Plastics;  Laterality: Bilateral;   COLONOSCOPY     DILATION AND CURETTAGE OF UTERUS  2016   D&E   KNEE ARTHROSCOPY     left   LIPOSUCTION WITH LIPOFILLING Bilateral 10/08/2015   Procedure: BILATERAL BREAST LIPO FILLING ;  Surgeon: Peggye Form, DO;  Location: [facility];  Service: Plastics;  Laterality: Bilateral;   MASTECTOMY Bilateral    REMOVAL OF BILATERAL TISSUE EXPANDERS WITH PLACEMENT OF BILATERAL BREAST IMPLANTS Bilateral 10/16/2014   Procedure: REMOVAL OF BILATERAL TISSUE EXPANDERS WITH PLACEMENT OF BILATERAL BREAST IMPLANTS;  Surgeon: Wayland Denis, DO;  Location: [facility] OR;  Service: Plastics;  Laterality: Bilateral;   TUBAL LIGATION     tubal reversal     wisdom teeth extraction      Prior to Admission medications   Medication Sig Start Date End Date Taking? Authorizing Provider  buPROPion (WELLBUTRIN) 75 MG tablet Take 75 mg by mouth daily.    [provider]  Cholecalciferol 50 MCG (2000 UT) CAPS Take 1 capsule by mouth daily. 09/13/16   [provider]  loratadine (CLARITIN REDITABS) 10 MG dissolvable tablet Take 10 mg by mouth daily.    [provider]  PEG 3350-KCl-NaCl-NaSulf-MgSul (SUFLAVE) 178.7 g SOLR Take 1 kit by mouth as directed. 09/14/23   Ottie Glazier, MD    Current Outpatient Medications  Medication Sig Dispense Refill   buPROPion (WELLBUTRIN) 75 MG tablet Take 75 mg by mouth daily.  Cholecalciferol 50 MCG (2000 UT) CAPS Take 1 capsule by mouth daily.     loratadine (CLARITIN REDITABS) 10 MG dissolvable tablet Take 10 mg by mouth daily.     Current Facility-Administered Medications  Medication Dose Route Frequency Provider Last Rate Last Admin   0.9 %  sodium chloride infusion  500 mL Intravenous Once Rocklyn Mayberry, Scarlette Currier, MD        Allergies as of 09/27/2023   (No Known Allergies)    Family History  Problem Relation Age of Onset   Breast cancer  Mother 81   Breast cancer Paternal Aunt 36   Breast cancer Maternal Grandmother 39   Prostate cancer Maternal Grandfather 78   Colon cancer Neg Hx    Esophageal cancer Neg Hx    Rectal cancer Neg Hx    Stomach cancer Neg Hx    Colon polyps Neg Hx     Social History   Socioeconomic History   Marital status: Married    Spouse name: Not on file   Number of children: 4   Years of education: Not on file   Highest education level: Not on file  Occupational History   Not on file  Tobacco Use   Smoking status: Never   Smokeless tobacco: Never  Vaping Use   Vaping status: Never Used  Substance and Sexual Activity   Alcohol use: No   Drug use: No   Sexual activity: Yes    Birth control/protection: Abstinence  Other Topics Concern   Not on file  Social History Narrative   Not on file   Social Drivers of Health   Financial Resource Strain: Not on file  Food Insecurity: Low Risk  (07/14/2023)   Received from [facility]   Hunger Vital Sign    Worried About Running Out of Food in the Last Year: Never true    Ran Out of Food in the Last Year: Never true  Transportation Needs: No Transportation Needs (07/14/2023)   Received from [facility]    In the past 12 months, has lack of reliable transportation kept you from medical appointments, meetings, work or from getting things needed for daily living? : No  Physical Activity: Not on file  Stress: Not on file  Social Connections: Not on file  Intimate Partner Violence: Not on file    Review of Systems:  All other review of systems negative except as mentioned in the HPI.  Physical Exam: Vital signs BP (!) 163/98   Pulse 72   Temp 98.3 F (36.8 C)   Resp (!) 27   Ht 5\' 5"  (1.651 m)   Wt 200 lb (90.7 kg)   LMP 09/14/2015   SpO2 96%   BMI 33.28 kg/m   General:   Alert,  Well-developed, well-nourished, pleasant and cooperative in NAD Airway:  Mallampati 1 Lungs:  Clear throughout to auscultation.    Heart:  Regular rate and rhythm; no murmurs, clicks, rubs,  or gallops. Abdomen:  Soft, nontender and nondistended. Normal bowel sounds.   Neuro/Psych:  Normal mood and affect. A and O x 3  Eugenia Hess, MD [facility]

## 2023-09-27 ENCOUNTER — Encounter: Payer: Self-pay | Admitting: Pediatrics

## 2023-09-27 ENCOUNTER — Ambulatory Visit (AMBULATORY_SURGERY_CENTER): Payer: Self-pay | Admitting: Pediatrics

## 2023-09-27 VITALS — BP 160/76 | HR 61 | Temp 98.3°F | Resp 17 | Ht 65.0 in | Wt 200.0 lb

## 2023-09-27 DIAGNOSIS — K644 Residual hemorrhoidal skin tags: Secondary | ICD-10-CM

## 2023-09-27 DIAGNOSIS — K5730 Diverticulosis of large intestine without perforation or abscess without bleeding: Secondary | ICD-10-CM | POA: Diagnosis not present

## 2023-09-27 DIAGNOSIS — Z1211 Encounter for screening for malignant neoplasm of colon: Secondary | ICD-10-CM | POA: Diagnosis present

## 2023-09-27 MED ORDER — SODIUM CHLORIDE 0.9 % IV SOLN: 500.0000 mL | Freq: Once | INTRAVENOUS | Status: DC

## 2023-09-27 NOTE — Progress Notes (Signed)
 Pt's states no medical or surgical changes since previsit or office visit.

## 2023-09-27 NOTE — Patient Instructions (Signed)
Resume previous diet and medications. Repeat Colonoscopy in 10 years for surveillance. Handout provided on Diverticulosis  YOU HAD AN ENDOSCOPIC PROCEDURE TODAY AT THE [facility]:   Refer to the procedure report that was given to you for any specific questions about what was found during the examination.  If the procedure report does not answer your questions, please call your gastroenterologist to clarify.  If you requested that your care partner not be given the details of your procedure findings, then the procedure report has been included in a sealed envelope for you to review at your convenience later.  YOU SHOULD EXPECT: Some feelings of bloating in the abdomen. Passage of more gas than usual.  Walking can help get rid of the air that was put into your GI tract during the procedure and reduce the bloating. If you had a lower endoscopy (such as a colonoscopy or flexible sigmoidoscopy) you may notice spotting of blood in your stool or on the toilet paper. If you underwent a bowel prep for your procedure, you may not have a normal bowel movement for a few days.  Please Note:  You might notice some irritation and congestion in your nose or some drainage.  This is from the oxygen used during your procedure.  There is no need for concern and it should clear up in a day or so.  SYMPTOMS TO REPORT IMMEDIATELY:  Following lower endoscopy (colonoscopy or flexible sigmoidoscopy):  Excessive amounts of blood in the stool  Significant tenderness or worsening of abdominal pains  Swelling of the abdomen that is new, acute  Fever of 100F or higher  For urgent or emergent issues, a gastroenterologist can be reached at any hour by calling [phone]. Do not use MyChart messaging for urgent concerns.    DIET:  We do recommend a small meal at first, but then you may proceed to your regular diet.  Drink plenty of fluids but you should avoid alcoholic beverages for 24 hours.  ACTIVITY:  You  should plan to take it easy for the rest of today and you should NOT DRIVE or use heavy machinery until tomorrow (because of the sedation medicines used during the test).    FOLLOW UP: Our staff will call the number listed on your records the next business day following your procedure.  We will call around 7:15- 8:00 am to check on you and address any questions or concerns that you may have regarding the information given to you following your procedure. If we do not reach you, we will leave a message.     If any biopsies were taken you will be contacted by phone or by letter within the next 1-3 weeks.  Please call us at [phone] if you have not heard about the biopsies in 3 weeks.    SIGNATURES/CONFIDENTIALITY: You and/or your care partner have signed paperwork which will be entered into your electronic medical record.  These signatures attest to the fact that that the information above on your After Visit Summary has been reviewed and is understood.  Full responsibility of the confidentiality of this discharge information lies with you and/or your care-partner.

## 2023-09-27 NOTE — Op Note (Signed)
 [facility] Patient Name: Debra Lowery Procedure Date: 09/27/2023 8:33 AM MRN: [id number] Endoscopist: Maren Beach , MD, [id number] Age: 48 Referring MD:  Date of Birth: January 18, 1976 Gender: Female Account #: [id number] Procedure:                Colonoscopy Indications:              Screening for colorectal malignant neoplasm, This                            is the patient's first colonoscopy Medicines:                Monitored Anesthesia Care Procedure:                Pre-Anesthesia Assessment:                           - Prior to the procedure, a History and Physical                            was performed, and patient medications and                            allergies were reviewed. The patient's tolerance of                            previous anesthesia was also reviewed. The risks                            and benefits of the procedure and the sedation                            options and risks were discussed with the patient.                            All questions were answered, and informed consent                            was obtained. Prior Anticoagulants: The patient has                            taken no anticoagulant or antiplatelet agents. ASA                            Grade Assessment: II - A patient with mild systemic                            disease. After reviewing the risks and benefits,                            the patient was deemed in satisfactory condition to                            undergo the procedure.  After obtaining informed consent, the colonoscope                            was passed under direct vision. Throughout the                            procedure, the patient's blood pressure, pulse, and                            oxygen saturations were monitored continuously. The                            Olympus Scope [id number] was introduced through the                            anus and  advanced to the cecum, identified by                            appendiceal orifice and ileocecal valve. The                            colonoscopy was performed without difficulty. The                            patient tolerated the procedure well. The quality                            of the bowel preparation was good. The ileocecal                            valve, appendiceal orifice, and rectum were                            photographed. Scope In: 8:43:10 AM Scope Out: 8:57:48 AM Scope Withdrawal Time: 0 hours 9 minutes 33 seconds  Total Procedure Duration: 0 hours 14 minutes 38 seconds  Findings:                 Skin tags were found on perianal exam.                           The digital rectal exam was normal. Pertinent                            negatives include normal sphincter tone and no                            palpable rectal lesions.                           A few small-mouthed diverticula were found in the                            sigmoid colon.  The exam was otherwise normal throughout the                            examined colon.                           The retroflexed view of the distal rectum and anal                            verge was normal and showed no anal or rectal                            abnormalities. Complications:            No immediate complications. Estimated Blood Loss:     Estimated blood loss: none. Impression:               - Perianal skin tags found on perianal exam.                           - Diverticulosis in the sigmoid colon.                           - No specimens collected. Recommendation:           - Discharge patient to home (ambulatory).                           - Repeat colonoscopy in 10 years for screening                            purposes.                           - The findings and recommendations were discussed                            with the designated responsible adult.                            - Return to referring physician.                           - Patient has a contact number available for                            emergencies. The signs and symptoms of potential                            delayed complications were discussed with the                            patient. Return to normal activities tomorrow.                            Written discharge instructions were provided to the  patient. Eugenia Hess, MD 09/27/2023 9:01:36 AM This report has been signed electronically.

## 2023-09-27 NOTE — Progress Notes (Signed)
 Report to PACU, RN, vss, BBS= Clear.

## 2023-09-28 ENCOUNTER — Telehealth: Payer: Self-pay

## 2023-09-28 NOTE — Telephone Encounter (Signed)
  Follow up Call-     09/27/2023    7:46 AM  Call back number  Post procedure Call Back phone  # [phone]  Permission to leave phone message Yes     Patient questions:  Do you have a fever, pain , or abdominal swelling? No. Pain Score  0 *  Have you tolerated food without any problems? Yes.    Have you been able to return to your normal activities? Yes.    Do you have any questions about your discharge instructions: Diet   No. Medications  No. Follow up visit  No.  Do you have questions or concerns about your Care? No.  Actions: * If pain score is 4 or above: No action needed, pain <4.
